# Patient Record
Sex: Female | Born: 1940 | Race: White | Hispanic: Yes | Marital: Married | Smoking: Former smoker
Health system: Southern US, Community
[De-identification: ages and names within clinical notes are randomized; demographics above are authoritative.]

## PROBLEM LIST (undated history)

## (undated) DIAGNOSIS — G8929 Other chronic pain: Secondary | ICD-10-CM

## (undated) DIAGNOSIS — G4700 Insomnia, unspecified: Secondary | ICD-10-CM

## (undated) DIAGNOSIS — F329 Major depressive disorder, single episode, unspecified: Secondary | ICD-10-CM

## (undated) DIAGNOSIS — G894 Chronic pain syndrome: Secondary | ICD-10-CM

## (undated) DIAGNOSIS — Z8673 Personal history of transient ischemic attack (TIA), and cerebral infarction without residual deficits: Secondary | ICD-10-CM

## (undated) DIAGNOSIS — K219 Gastro-esophageal reflux disease without esophagitis: Secondary | ICD-10-CM

## (undated) DIAGNOSIS — K5792 Diverticulitis of intestine, part unspecified, without perforation or abscess without bleeding: Secondary | ICD-10-CM

## (undated) DIAGNOSIS — F32A Depression, unspecified: Secondary | ICD-10-CM

## (undated) DIAGNOSIS — G459 Transient cerebral ischemic attack, unspecified: Secondary | ICD-10-CM

## (undated) DIAGNOSIS — N189 Chronic kidney disease, unspecified: Secondary | ICD-10-CM

## (undated) DIAGNOSIS — I10 Essential (primary) hypertension: Secondary | ICD-10-CM

## (undated) DIAGNOSIS — I2510 Atherosclerotic heart disease of native coronary artery without angina pectoris: Secondary | ICD-10-CM

## (undated) DIAGNOSIS — D509 Iron deficiency anemia, unspecified: Secondary | ICD-10-CM

## (undated) DIAGNOSIS — M1990 Unspecified osteoarthritis, unspecified site: Secondary | ICD-10-CM

## (undated) DIAGNOSIS — M961 Postlaminectomy syndrome, not elsewhere classified: Secondary | ICD-10-CM

## (undated) DIAGNOSIS — D649 Anemia, unspecified: Secondary | ICD-10-CM

## (undated) DIAGNOSIS — E785 Hyperlipidemia, unspecified: Secondary | ICD-10-CM

## (undated) DIAGNOSIS — I639 Cerebral infarction, unspecified: Secondary | ICD-10-CM

## (undated) HISTORY — PX: JOINT REPLACEMENT: SHX530

## (undated) HISTORY — PX: ABDOMINAL HYSTERECTOMY: SHX81

## (undated) HISTORY — PX: FRACTURE SURGERY: SHX138

## (undated) HISTORY — PX: KNEE ARTHROSCOPY: SUR90

---
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
## 2012-08-29 DIAGNOSIS — K219 Gastro-esophageal reflux disease without esophagitis: Secondary | ICD-10-CM | POA: Insufficient documentation

## 2012-08-29 DIAGNOSIS — E785 Hyperlipidemia, unspecified: Secondary | ICD-10-CM | POA: Insufficient documentation

## 2012-08-29 DIAGNOSIS — G459 Transient cerebral ischemic attack, unspecified: Secondary | ICD-10-CM | POA: Insufficient documentation

## 2012-08-29 DIAGNOSIS — K5792 Diverticulitis of intestine, part unspecified, without perforation or abscess without bleeding: Secondary | ICD-10-CM | POA: Insufficient documentation

## 2012-08-29 HISTORY — DX: Gastro-esophageal reflux disease without esophagitis: K21.9

## 2013-03-04 DIAGNOSIS — G4700 Insomnia, unspecified: Secondary | ICD-10-CM | POA: Insufficient documentation

## 2013-03-04 DIAGNOSIS — I10 Essential (primary) hypertension: Secondary | ICD-10-CM | POA: Insufficient documentation

## 2013-07-27 DIAGNOSIS — M1990 Unspecified osteoarthritis, unspecified site: Secondary | ICD-10-CM | POA: Insufficient documentation

## 2014-01-12 DIAGNOSIS — F32A Depression, unspecified: Secondary | ICD-10-CM | POA: Insufficient documentation

## 2014-07-21 DIAGNOSIS — M5136 Other intervertebral disc degeneration, lumbar region: Secondary | ICD-10-CM | POA: Insufficient documentation

## 2019-02-25 ENCOUNTER — Encounter: Payer: Self-pay | Admitting: Emergency Medicine

## 2019-02-25 ENCOUNTER — Observation Stay
Admission: EM | Admit: 2019-02-25 | Discharge: 2019-02-26 | Disposition: A | Discharge status: Home / Self Care | Payer: Medicare Other | Attending: Internal Medicine | Admitting: Internal Medicine

## 2019-02-25 ENCOUNTER — Other Ambulatory Visit: Payer: Self-pay

## 2019-02-25 ENCOUNTER — Observation Stay (HOSPITAL_BASED_OUTPATIENT_CLINIC_OR_DEPARTMENT_OTHER)
Admit: 2019-02-25 | Discharge: 2019-02-25 | Disposition: A | Discharge status: Home / Self Care | Payer: Medicare Other | Attending: Internal Medicine | Admitting: Internal Medicine

## 2019-02-25 ENCOUNTER — Observation Stay: Payer: Medicare Other

## 2019-02-25 ENCOUNTER — Emergency Department: Payer: Medicare Other

## 2019-02-25 DIAGNOSIS — I2510 Atherosclerotic heart disease of native coronary artery without angina pectoris: Secondary | ICD-10-CM | POA: Diagnosis not present

## 2019-02-25 DIAGNOSIS — R079 Chest pain, unspecified: Secondary | ICD-10-CM

## 2019-02-25 DIAGNOSIS — I119 Hypertensive heart disease without heart failure: Secondary | ICD-10-CM | POA: Diagnosis not present

## 2019-02-25 DIAGNOSIS — I080 Rheumatic disorders of both mitral and aortic valves: Secondary | ICD-10-CM | POA: Diagnosis not present

## 2019-02-25 DIAGNOSIS — R001 Bradycardia, unspecified: Secondary | ICD-10-CM | POA: Diagnosis not present

## 2019-02-25 DIAGNOSIS — G4700 Insomnia, unspecified: Secondary | ICD-10-CM | POA: Insufficient documentation

## 2019-02-25 DIAGNOSIS — Z87891 Personal history of nicotine dependence: Secondary | ICD-10-CM | POA: Diagnosis not present

## 2019-02-25 DIAGNOSIS — Z7982 Long term (current) use of aspirin: Secondary | ICD-10-CM | POA: Diagnosis not present

## 2019-02-25 DIAGNOSIS — R778 Other specified abnormalities of plasma proteins: Secondary | ICD-10-CM

## 2019-02-25 DIAGNOSIS — Z8673 Personal history of transient ischemic attack (TIA), and cerebral infarction without residual deficits: Secondary | ICD-10-CM | POA: Insufficient documentation

## 2019-02-25 DIAGNOSIS — I160 Hypertensive urgency: Secondary | ICD-10-CM | POA: Diagnosis not present

## 2019-02-25 DIAGNOSIS — N179 Acute kidney failure, unspecified: Secondary | ICD-10-CM | POA: Diagnosis not present

## 2019-02-25 DIAGNOSIS — I214 Non-ST elevation (NSTEMI) myocardial infarction: Secondary | ICD-10-CM

## 2019-02-25 DIAGNOSIS — R0602 Shortness of breath: Secondary | ICD-10-CM | POA: Diagnosis not present

## 2019-02-25 DIAGNOSIS — Z79899 Other long term (current) drug therapy: Secondary | ICD-10-CM | POA: Insufficient documentation

## 2019-02-25 DIAGNOSIS — Z20828 Contact with and (suspected) exposure to other viral communicable diseases: Secondary | ICD-10-CM | POA: Insufficient documentation

## 2019-02-25 DIAGNOSIS — I10 Essential (primary) hypertension: Secondary | ICD-10-CM | POA: Diagnosis not present

## 2019-02-25 DIAGNOSIS — Z8249 Family history of ischemic heart disease and other diseases of the circulatory system: Secondary | ICD-10-CM | POA: Insufficient documentation

## 2019-02-25 DIAGNOSIS — R7989 Other specified abnormal findings of blood chemistry: Secondary | ICD-10-CM | POA: Diagnosis not present

## 2019-02-25 HISTORY — DX: Cerebral infarction, unspecified: I63.9

## 2019-02-25 HISTORY — DX: Atherosclerotic heart disease of native coronary artery without angina pectoris: I25.10

## 2019-02-25 HISTORY — DX: Insomnia, unspecified: G47.00

## 2019-02-25 HISTORY — DX: Essential (primary) hypertension: I10

## 2019-02-25 HISTORY — DX: Other chronic pain: G89.29

## 2019-02-25 LAB — COMPREHENSIVE METABOLIC PANEL
ALT: 17 U/L (ref 0–44)
AST: 25 U/L (ref 15–41)
Albumin: 4.5 g/dL (ref 3.5–5.0)
Alkaline Phosphatase: 68 U/L (ref 38–126)
Anion gap: 11 (ref 5–15)
BUN: 26 mg/dL — ABNORMAL HIGH (ref 8–23)
CO2: 22 mmol/L (ref 22–32)
Calcium: 9.7 mg/dL (ref 8.9–10.3)
Chloride: 103 mmol/L (ref 98–111)
Creatinine, Ser: 1.17 mg/dL — ABNORMAL HIGH (ref 0.44–1.00)
GFR calc Af Amer: 51 mL/min — ABNORMAL LOW (ref >60)
GFR calc non Af Amer: 44 mL/min — ABNORMAL LOW (ref >60)
Glucose, Bld: 91 mg/dL (ref 70–99)
Potassium: 3.9 mmol/L (ref 3.5–5.1)
Sodium: 136 mmol/L (ref 135–145)
Total Bilirubin: 0.9 mg/dL (ref 0.3–1.2)
Total Protein: 7.5 g/dL (ref 6.5–8.1)

## 2019-02-25 LAB — LIPID PANEL
Cholesterol: 290 mg/dL — ABNORMAL HIGH (ref 0–200)
HDL: 90 mg/dL (ref >40)
LDL Cholesterol: 181 mg/dL — ABNORMAL HIGH (ref 0–99)
Total CHOL/HDL Ratio: 3.2 RATIO
Triglycerides: 94 mg/dL (ref <150)
VLDL: 19 mg/dL (ref 0–40)

## 2019-02-25 LAB — LIPASE, BLOOD: Lipase: 29 U/L (ref 11–51)

## 2019-02-25 LAB — MAGNESIUM: Magnesium: 2.4 mg/dL (ref 1.7–2.4)

## 2019-02-25 LAB — CBC
HCT: 39.1 % (ref 36.0–46.0)
Hemoglobin: 13 g/dL (ref 12.0–15.0)
MCH: 30.2 pg (ref 26.0–34.0)
MCHC: 33.2 g/dL (ref 30.0–36.0)
MCV: 90.7 fL (ref 80.0–100.0)
Platelets: 173 10*3/uL (ref 150–400)
RBC: 4.31 MIL/uL (ref 3.87–5.11)
RDW: 13.9 % (ref 11.5–15.5)
WBC: 5.7 10*3/uL (ref 4.0–10.5)
nRBC: 0 % (ref 0.0–0.2)

## 2019-02-25 LAB — FIBRIN DERIVATIVES D-DIMER (ARMC ONLY): Fibrin derivatives D-dimer (ARMC): 2896.34 ng/mL (FEU) — ABNORMAL HIGH (ref 0.00–499.00)

## 2019-02-25 LAB — PROTIME-INR
INR: 1 (ref 0.8–1.2)
Prothrombin Time: 12.9 seconds (ref 11.4–15.2)

## 2019-02-25 LAB — HEMOGLOBIN A1C
Hgb A1c MFr Bld: 5.5 % (ref 4.8–5.6)
Mean Plasma Glucose: 111.15 mg/dL

## 2019-02-25 LAB — HEPARIN LEVEL (UNFRACTIONATED): Heparin Unfractionated: 0.16 IU/mL — ABNORMAL LOW (ref 0.30–0.70)

## 2019-02-25 LAB — SARS CORONAVIRUS 2 BY RT PCR (HOSPITAL ORDER, PERFORMED IN ~~LOC~~ HOSPITAL LAB): SARS Coronavirus 2: NEGATIVE

## 2019-02-25 LAB — TROPONIN I (HIGH SENSITIVITY)
Troponin I (High Sensitivity): 121 ng/L (ref <18)
Troponin I (High Sensitivity): 107 ng/L (ref <18)

## 2019-02-25 LAB — TSH: TSH: 1.847 u[IU]/mL (ref 0.350–4.500)

## 2019-02-25 LAB — APTT: aPTT: <24 seconds — ABNORMAL LOW (ref 24–36)

## 2019-02-25 MED ORDER — HYDROCODONE-ACETAMINOPHEN 10-325 MG PO TABS
1.0000 | ORAL_TABLET | ORAL | Status: DC | PRN
  Filled 2019-02-25: qty 1

## 2019-02-25 MED ORDER — NITROGLYCERIN 0.4 MG SL SUBL: 0.4000 mg | SUBLINGUAL_TABLET | SUBLINGUAL | Status: DC | PRN

## 2019-02-25 MED ORDER — ATORVASTATIN CALCIUM 20 MG PO TABS: 20.0000 mg | ORAL_TABLET | Freq: Every day | ORAL | Status: DC

## 2019-02-25 MED ORDER — LORAZEPAM 2 MG/ML IJ SOLN
0.5000 mg | Freq: Once | INTRAMUSCULAR | Status: AC
  Administered 2019-02-25: 12:00:00 0.5 mg via INTRAVENOUS

## 2019-02-25 MED ORDER — HYDRALAZINE HCL 25 MG PO TABS
25.0000 mg | ORAL_TABLET | Freq: Three times a day (TID) | ORAL | Status: DC
  Administered 2019-02-25: 25 mg via ORAL
  Filled 2019-02-25: qty 1

## 2019-02-25 MED ORDER — ASPIRIN EC 81 MG PO TBEC
81.0000 mg | DELAYED_RELEASE_TABLET | Freq: Every day | ORAL | Status: DC
  Administered 2019-02-26: 81 mg via ORAL
  Filled 2019-02-25: qty 1

## 2019-02-25 MED ORDER — SODIUM CHLORIDE 0.9 % IV SOLN
INTRAVENOUS | Status: DC
  Administered 2019-02-25 – 2019-02-26 (×3): via INTRAVENOUS

## 2019-02-25 MED ORDER — LISINOPRIL 20 MG PO TABS: 20.0000 mg | ORAL_TABLET | Freq: Every day | ORAL | Status: DC

## 2019-02-25 MED ORDER — HEPARIN (PORCINE) 25000 UT/250ML-% IV SOLN
1000.0000 [IU]/h | INTRAVENOUS | Status: DC
  Administered 2019-02-25: 750 [IU]/h via INTRAVENOUS
  Filled 2019-02-25: qty 250

## 2019-02-25 MED ORDER — ATORVASTATIN CALCIUM 20 MG PO TABS
20.0000 mg | ORAL_TABLET | Freq: Every day | ORAL | Status: DC
  Administered 2019-02-25: 20 mg via ORAL
  Filled 2019-02-25: qty 1

## 2019-02-25 MED ORDER — HEPARIN BOLUS VIA INFUSION
1500.0000 [IU] | Freq: Once | INTRAVENOUS | Status: AC
  Administered 2019-02-25: 1500 [IU] via INTRAVENOUS
  Filled 2019-02-25: qty 1500

## 2019-02-25 MED ORDER — HYDRALAZINE HCL 20 MG/ML IJ SOLN
20.0000 mg | Freq: Once | INTRAMUSCULAR | Status: AC
  Administered 2019-02-25: 20 mg via INTRAVENOUS
  Filled 2019-02-25: qty 1

## 2019-02-25 MED ORDER — ASPIRIN 81 MG PO CHEW
324.0000 mg | CHEWABLE_TABLET | Freq: Once | ORAL | Status: AC
  Administered 2019-02-25: 324 mg via ORAL
  Filled 2019-02-25: qty 4

## 2019-02-25 MED ORDER — LORAZEPAM 2 MG/ML IJ SOLN
INTRAMUSCULAR | Status: AC
  Administered 2019-02-25: 0.5 mg via INTRAVENOUS
  Filled 2019-02-25: qty 1

## 2019-02-25 MED ORDER — HYDRALAZINE HCL 10 MG PO TABS
10.0000 mg | ORAL_TABLET | ORAL | Status: DC | PRN
  Filled 2019-02-25: qty 1

## 2019-02-25 MED ORDER — SODIUM CHLORIDE 0.9 % IV SOLN
Freq: Once | INTRAVENOUS | Status: AC
  Administered 2019-02-25: 12:00:00 via INTRAVENOUS

## 2019-02-25 MED ORDER — TRAZODONE HCL 100 MG PO TABS
200.0000 mg | ORAL_TABLET | Freq: Every day | ORAL | Status: DC
  Administered 2019-02-25: 200 mg via ORAL
  Filled 2019-02-25: qty 2

## 2019-02-25 MED ORDER — IOHEXOL 350 MG/ML SOLN
75.0000 mL | Freq: Once | INTRAVENOUS | Status: AC | PRN
  Administered 2019-02-25: 75 mL via INTRAVENOUS

## 2019-02-25 MED ORDER — HEPARIN BOLUS VIA INFUSION
4000.0000 [IU] | Freq: Once | INTRAVENOUS | Status: AC
  Administered 2019-02-25: 4000 [IU] via INTRAVENOUS
  Filled 2019-02-25: qty 4000

## 2019-02-25 MED ORDER — CARVEDILOL 3.125 MG PO TABS
3.1250 mg | ORAL_TABLET | Freq: Two times a day (BID) | ORAL | Status: DC
  Administered 2019-02-25 – 2019-02-26 (×2): 3.125 mg via ORAL
  Filled 2019-02-25 (×2): qty 1

## 2019-02-25 NOTE — ED Notes (Addendum)
Pt up to bedside toilet. Urinated.  

## 2019-02-25 NOTE — ED Notes (Signed)
Verbal per EDP Paduchowski to give Ativan.

## 2019-02-25 NOTE — ED Notes (Signed)
Portable Xray at bedside.

## 2019-02-25 NOTE — ED Notes (Signed)
Pt beginning to relax slowly. Still feels like she "can't breathe" but states that it is getting better. Given warm blankets. Family remains with pt.

## 2019-02-25 NOTE — ED Notes (Signed)
Pt suddenly anxious and sweaty per Radonna Ricker RN. Ally RN completed EKG and called EDP Paduchowski to update. Pt ST on heart monitor. BP readings not necessarily accurate as pt keeps tensing up her arm. Tearful.

## 2019-02-25 NOTE — ED Notes (Signed)
Pt requesting food. Messaged attending provider.

## 2019-02-25 NOTE — ED Notes (Signed)
Pt c/o feeling like her "L foot isn't getting good circulation." Warm, appropriate color, pulse 2+, cap refill <3 sec. Pt can wiggle toes and move leg.

## 2019-02-25 NOTE — Consult Note (Signed)
ANTICOAGULATION CONSULT NOTE - Initial Consult  Pharmacy Consult for Heparin infusion doing and monitoring   Indication: chest pain/ACS  No Known Allergies  Patient Measurements: Height: 5\' 5"  (165.1 cm) Weight: 135 lb 9.6 oz (61.5 kg) IBW/kg (Calculated) : 57  Vital Signs: Temp: 98.7 F (37.1 C) (09/29 0855) Temp Source: Oral (09/29 0855) BP: 124/49 (09/29 1534) Pulse Rate: 80 (09/29 1534)  Labs: Recent Labs    02/25/19 0944 02/25/19 1117 02/25/19 1919  HGB 13.0  --   --   HCT 39.1  --   --   PLT 173  --   --   APTT <24*  --   --   LABPROT 12.9  --   --   INR 1.0  --   --   HEPARINUNFRC  --   --  0.16*  CREATININE 1.17*  --   --   TROPONINIHS 121* 107*  --     Estimated Creatinine Clearance: 35.1 mL/min (A) (by C-G formula based on SCr of 1.17 mg/dL (H)).   Medical History: Past Medical History:  Diagnosis Date  . Chronic back pain   . Coronary artery disease   . Hypertension   . Insomnia   . Stroke [facility])    Assessment: Pharmacy consulted for heparin infusion dosing and monitoring for ACS/STEMI for 78 yo female. No anticoagulants reported PTA.   9/29@1930 : HL 0.16  Goal of Therapy:  Heparin level 0.3-0.7 units/ml Monitor platelets by anticoagulation protocol: Yes   Plan:  Will rebolus with 1500 units x 1 Increasing rate of heparin infusion to 850 units/hr Check anti-Xa level in 8 hours and daily while on heparin Continue to monitor H&H and platelets  Pearla Dubonnet, PharmD Clinical Pharmacist 02/25/2019 7:50 PM

## 2019-02-25 NOTE — Consult Note (Signed)
Cardiology Consultation:   Patient ID: Emma Houston MRN: [id number]; DOB: 08-29-1940  Admit date: 02/25/2019 Date of Consult: 02/25/2019  Primary Care Provider: System, Pcp Not In Primary Cardiologist:[facility], Dr. Okey Dupre rounding Primary Electrophysiologist:  None    Patient Profile:   Emma Houston is a 78 y.o. female with a hx of HTN, TIA, remote history of smoking (quit ~40 years ago), and no known history of heart disease or arrhythmia who is being seen today for the evaluation of SOB and chest pain with troponin elevation at the request of Dr. Enid Baas.  History of Present Illness:   Emma Houston is a 78 year old female with PMH as above.  On interview today, both patient and daughter deny previous history of heart disease or arrhythmia.  She reportedly has a history of smoking (quit ~40 years ago) and very rare alcohol use (wine 1-2 times per month). Her father passed away from a heart attack in his 56s.  She also reported a strong family history of diabetes.  On 02/24/2019, the patient reported that she was watching television with her family when she became acutely short of breath with associated anxiety.  She denied any additional/associated symptoms with her shortness of breath.  No chest pain, racing heart rate, palpitations, presyncope, or syncope. She also denied any recent lower extremity edema, abdominal distention, or orthopnea.  The shortness of breath started at rest and caused her so much anxiety that she immediately threw off her coat and went outside in an attempt to feel some improvement. She is unable to remember if she felt associated flushing or diaphoresis at that time.  She went outside without much improvement in her symptoms.  She continued to feel shortness of breath on and off throughout the night and did not sleep well.  She also noted some recent left lower extremity pain, though denied associated edema or erythema.  The morning of 02/25/2019, she felt mild to  moderate left-sided, non-radiating, non-positional, and nonpleuritic chest pain that was not tender to palpation.  She had never experienced chest pain like this before in the past.  She is unable to recall how long this episode of chest pain lasted.  When she continued to feel short of breath, she decided to report to [facility] emergency department.    In the ED, vitals were significant for blood pressure 194/71 and heart rate of 55 bpm.  Labs significant for creatinine 1.17, BUN 26, potassium 3.9, AST 25, ALT 17, hemoglobin 13.0, WBC 5.7, HS Tn 121 and flat trending.  EKG bradycardic with rate 57bpm and prolonged PRi suspicious for AVB (with repeat EKG pending) and with significant baseline wander. Also noted were repolarization changes with IVCD / QRS 98; however, no acute ST or T changes. Given EKG difficult to assess due to baseline wander, repeat EKG ordered.  She was started on a heparin drip with cardiology consulted.   At the time of cardiology consultation, she denied any current chest pain.  Her main concern was noted to be her shortness of breath.  During the physical exam, chest pain was not reproducible by palpation and she noted her last episode of chest pain is occurring that morning but was still unable to recall the length of the chest pain episode or if the CP was improved with medications in the ED.   Heart Pathway Score:     Past Medical History:  Diagnosis Date  . Chronic back pain   . Coronary artery disease   . Hypertension   .  Insomnia   . Stroke [facility])     History reviewed. No pertinent surgical history.   Home Medications:  Prior to Admission medications   Medication Sig Start Date End Date Taking? Authorizing Provider  HYDROcodone-acetaminophen (NORCO) 10-325 MG tablet Take 1 tablet by mouth every 4 (four) hours as needed for pain. 01/31/19  Yes [provider]  lisinopril (ZESTRIL) 20 MG tablet Take 20 mg by mouth daily. 02/12/19  Yes [provider]   traZODone (DESYREL) 100 MG tablet Take 200 mg by mouth at bedtime. 02/22/19  Yes [provider]    Inpatient Medications: Scheduled Meds: . [START ON 02/26/2019] aspirin EC  81 mg Oral Daily  . carvedilol  3.125 mg Oral BID WC  . hydrALAZINE  25 mg Oral Q8H  . lisinopril  20 mg Oral Daily  . traZODone  200 mg Oral QHS   Continuous Infusions: . heparin 750 Units/hr (02/25/19 1134)   PRN Meds: HYDROcodone-acetaminophen, nitroGLYCERIN  Allergies:   No Known Allergies  Social History:   Social History   Socioeconomic History  . Marital status: Married    Spouse name: Not on file  . Number of children: Not on file  . Years of education: Not on file  . Highest education level: Not on file  Occupational History  . Not on file  Social Needs  . Financial resource strain: Not on file  . Food insecurity    Worry: Not on file    Inability: Not on file  . Transportation needs    Medical: Not on file    Non-medical: Not on file  Tobacco Use  . Smoking status: Former Games developer  . Smokeless tobacco: Never Used  Substance and Sexual Activity  . Alcohol use: Yes    Alcohol/week: 1.0 standard drinks    Types: 1 Cans of beer per week  . Drug use: Yes    Comment: prescribed for chronic back pain  . Sexual activity: Not on file  Lifestyle  . Physical activity    Days per week: Not on file    Minutes per session: Not on file  . Stress: Not on file  Relationships  . Social Musician on phone: Not on file    Gets together: Not on file    Attends religious service: Not on file    Active member of club or organization: Not on file    Attends meetings of clubs or organizations: Not on file    Relationship status: Not on file  . Intimate partner violence    Fear of current or ex partner: Not on file    Emotionally abused: Not on file    Physically abused: Not on file    Forced sexual activity: Not on file  Other Topics Concern  . Not on file  Social History  Narrative  . Not on file    Family History:   Family history of CAD with father deceased from heart attack in his 73s.  Family history of diabetes.  ROS:  Please see the history of present illness.  Review of Systems  Respiratory: Positive for shortness of breath. Negative for hemoptysis.   Cardiovascular: Positive for chest pain and PND. Negative for palpitations, orthopnea and leg swelling.  Gastrointestinal: Negative for nausea and vomiting.  Musculoskeletal: Negative for falls.  Neurological: Negative for loss of consciousness.  Psychiatric/Behavioral: The patient is nervous/anxious.   All other systems reviewed and are negative.   All other ROS reviewed and  negative.     Physical Exam/Data:   Vitals:   02/25/19 1326 02/25/19 1334 02/25/19 1345 02/25/19 1400  BP:  (!) 151/64    Pulse: 83  83 81  Resp: 18  14 13   Temp:      TempSrc:      SpO2: 97%  97% 97%  Weight:      Height:       No intake or output data in the 24 hours ending 02/25/19 1416 Last 3 Weights 02/25/2019  Weight (lbs) 143 lb  Weight (kg) 64.864 kg     Body mass index is 23.8 kg/m.  General:  Well nourished, well developed, in no acute distress but anxious on exam/interview.  Joined by her daughter. HEENT: normal Neck: no JVD Vascular: No carotid bruits; radial pulses 2+ bilaterally Cardiac:  normal S1, S2; RRR; no murmur  Lungs:  clear to auscultation bilaterally, no wheezing, rhonchi or rales  Abd: soft, nontender, no hepatomegaly  Ext: no edema Musculoskeletal:  No deformities, BUE and BLE strength normal and equal Skin: warm and dry  Neuro:  No focal abnormalities noted Psych:  Normal affect   EKG:  The EKG was personally reviewed and demonstrates:  EKG bradycardic rate / with prolonged PRi suspicious for first degree AV block; however, significant baseline wander and repeat EKG pending. Possible ectopic rhythm noted. Repolarization changes with IVCD and QRS 98 but no acute ST or T changes  though difficult to assess due to baseline wander. Repeat EKG pending. Telemetry:  Telemetry was personally reviewed and demonstrates:  NSR with rates in the 80s. Earlier bradycardic rates in the 50s. Rare ectopy/ PVC.   Relevant CV Studies: Pending echo  Laboratory Data:  High Sensitivity Troponin:   Recent Labs  Lab 02/25/19 0944 02/25/19 1117  TROPONINIHS 121* 107*     Cardiac EnzymesNo results for input(s): TROPONINI in the last 168 hours. No results for input(s): TROPIPOC in the last 168 hours.  Chemistry Recent Labs  Lab 02/25/19 0944  NA 136  K 3.9  CL 103  CO2 22  GLUCOSE 91  BUN 26*  CREATININE 1.17*  CALCIUM 9.7  GFRNONAA 44*  GFRAA 51*  ANIONGAP 11    Recent Labs  Lab 02/25/19 0944  PROT 7.5  ALBUMIN 4.5  AST 25  ALT 17  ALKPHOS 68  BILITOT 0.9   Hematology Recent Labs  Lab 02/25/19 0944  WBC 5.7  RBC 4.31  HGB 13.0  HCT 39.1  MCV 90.7  MCH 30.2  MCHC 33.2  RDW 13.9  PLT 173   BNPNo results for input(s): BNP, PROBNP in the last 168 hours.  DDimer No results for input(s): DDIMER in the last 168 hours.   Radiology/Studies:  Dg Chest Portable 1 View  Result Date: 02/25/2019 CLINICAL DATA:  Chest pain, shortness of breath EXAM: PORTABLE CHEST 1 VIEW COMPARISON:  None. FINDINGS: The heart size and mediastinal contours are within normal limits. Both lungs are clear. Levoscoliosis of the thoracic spine. IMPRESSION: No acute abnormality of the lungs in AP portable projection. Electronically Signed   By: Lauralyn PrimesAlex  Bibbey M.D.   On: 02/25/2019 09:24    Assessment and Plan:   Chest pain with minimally elevated / flat trending troponin --No current CP.  Cannot rule out cardiac etiology and that shortness of breath is her anginal equivalent as she has several risk factors for cardiac etiology, including previous history of smoking and poorly controlled blood pressure.  EKG as above wiith repeat EKG  ordered to confirm findings. HS Tn minimally elevated  and flat trending, peaked at 121, now down-trending, and more consistent with supply demand ischemia with SBP into the 200s on arrival.  --Consider chest pain / SOB in the setting of elevated blood pressure.  Patient unable to report if chest pain improved with SBP decrease. --Cannot rule out pulmonary embolism with report of left leg pain. Exam not consistent with DVT given no asymmetric edema, erythema, or palpable cord on exam. Echo pending. Will evaluate echo for any signs of right heart strain.  Already on IV heparin with recommendation to continue at this time.  --Pending labs for further risk stratification, including lipid panel and A1c. --No plan for emergent cardiac catheterization at this time unless echo with significantly reduced EF. Consider NM study / stress testing in the AM for further risk stratification if no further chest pain reported / bump in HS Tn and pending echo results.  --Continue medical management with ASA, heparin, Coreg, and PTA lisinopril (ACE as renal function allows). SL nitro as needed for CP. Pending lipid panel, consider statin before discharge. Daily BMET as baseline renal function unknown and to monitor electrolytes.  Daily CBC on IV heparin. Further recommendations pending echo.  Bradycardia, resolved --Earlier bradycardic rates noted in EMR with higher SBP and now resolved with repeat EKG pending to assess rhythm  / PR interval given baseline wander on initial EKG. --Currently, rates in upper 80s-90s with improved BP. Started on Coreg given SBP in the 150s-190s. Continue as HR allows and pending EKG.    HTN --Poorly controlled upon arrival to ED with SBP in the 200s. Continue PTA lisinopril 20mg  qd. Started on low-dose Coreg 3.125 mg twice daily as heart rate allows.  Continue with hydralazine. Further recommendations pending echo.  History of TIA --Patient recently relocated to [facility] and no current access to medical records.   Reduced renal function / AKI  --Baseline Cr unknown. Monitor given elevated BUN and on ACEi.   For questions or updates, please contact [place] Please consult [email or web address] for contact info under     Signed, Arvil Chaco, PA-C  02/25/2019 2:16 PM

## 2019-02-25 NOTE — ED Triage Notes (Signed)
Says not feeling wsell since Friday--vomited x 1 then yedsterday started chest pain and short of breath

## 2019-02-25 NOTE — Consult Note (Signed)
ANTICOAGULATION CONSULT NOTE - Initial Consult  Pharmacy Consult for Heparin infusion doing and monitoring   Indication: chest pain/ACS  No Known Allergies  Patient Measurements: Height: 5\' 5"  (165.1 cm) Weight: 143 lb (64.9 kg) IBW/kg (Calculated) : 57  Vital Signs: Temp: 98.7 F (37.1 C) (09/29 0855) Temp Source: Oral (09/29 0855) BP: 125/93 (09/29 1000) Pulse Rate: 57 (09/29 1000)  Labs: Recent Labs    02/25/19 0944  HGB 13.0  HCT 39.1  PLT 173  CREATININE 1.17*  TROPONINIHS 121*    Estimated Creatinine Clearance: 35.1 mL/min (A) (by C-G formula based on SCr of 1.17 mg/dL (H)).   Medical History: Past Medical History:  Diagnosis Date  . Chronic back pain   . Coronary artery disease   . Hypertension   . Insomnia   . Stroke [facility])    Assessment: Pharmacy consulted for heparin infusion dosing and monitoring for ACS/STEMI for 78 yo female. No anticoagulants reported PTA.   Troponin I (HS): 121  Goal of Therapy:  Heparin level 0.3-0.7 units/ml Monitor platelets by anticoagulation protocol: Yes   Plan:  Give 4000 units bolus x 1 Start heparin infusion at 750 units/hr Check anti-Xa level in 8 hours and daily while on heparin Continue to monitor H&H and platelets  Pernell Dupre, PharmD, BCPS Clinical Pharmacist 02/25/2019 10:46 AM

## 2019-02-25 NOTE — Progress Notes (Signed)
Care Alignment Note  Advanced Directives Documents (Living Will, Power of Attorney) currently in the EHR no advanced directives documents available .  Has the patient discussed their wishes with their family/healthcare power of attorney no.  What does the patient/decision maker understand about their medical condition and the natural course of their disease.  Chest pain.  Elevated troponin.  Hypertensive urgency.  CVA.  What is the patient/decision maker's biggest fear or concern for the future becoming a burden to my family  What is the most important goal for this patient should their health condition worsen maintenance of function.  Current   Code Status: Full Code  Current code status has been reviewed/updated.  Time spent:17 minutes

## 2019-02-25 NOTE — ED Provider Notes (Addendum)
[facility] Emergency Department Provider Note  Time seen: 9:00 AM  I have reviewed the triage vital signs and the nursing notes.   HISTORY  Chief Complaint Chest Pain and Shortness of Breath   HPI Emma Houston is a 78 y.o. female with a past medical history of CAD, hypertension, CVA, presents to the emergency department for chest pain or shortness of breath.  According to the patient since last night she has been feeling mildly short of breath with chest discomfort in the center of her chest.  Describes the chest pain as an aching 4/10 pain currently.  Patient denies any cough or fever.  Did state intermittent nausea over the weekend with one episode of vomiting on Friday.  Denies leg pain or swelling.   Past Medical History:  Diagnosis Date  . Coronary artery disease   . Hypertension   . Stroke [facility])     There are no active problems to display for this patient.    Prior to Admission medications   Not on File    Not on File  No family history on file.  Social History Social History   Tobacco Use  . Smoking status: Not on file  Substance Use Topics  . Alcohol use: Not on file  . Drug use: Not on file    Review of Systems Constitutional: Negative for fever. Cardiovascular: Negative for chest pain. Respiratory: Negative for shortness of breath. Gastrointestinal: Negative for abdominal pain.  Intermittent nausea over the weekend with one episode of vomiting on Friday. Musculoskeletal: Negative for leg pain or swelling. Skin: Negative for skin complaints  Neurological: Negative for headache All other ROS negative  ____________________________________________   PHYSICAL EXAM:  VITAL SIGNS: ED Triage Vitals [02/25/19 0855]  Enc Vitals Group     BP (!) 190/72     Pulse Rate (!) 57     Resp 18     Temp 98.7 F (37.1 C)     Temp Source Oral     SpO2 100 %     Weight      Height      Head Circumference      Peak Flow      Pain  Score      Pain Loc      Pain Edu?      Excl. in GC?    Constitutional: Patient is awake alert oriented.  Patient is moderately anxious in appearance. Eyes: Normal exam ENT      Head: Normocephalic and atraumatic.      Mouth/Throat: Mucous membranes are moist. Cardiovascular: Normal rate, regular rhythm. No murmur Respiratory: Normal respiratory effort without tachypnea nor retractions. Breath sounds are clear Gastrointestinal: Soft and nontender. No distention.   Musculoskeletal: Nontender with normal range of motion in all extremities.  Neurologic:  Normal speech and language. No gross focal neurologic deficits Skin:  Skin is warm, dry and intact.  Psychiatric: Anxious appearing otherwise normal mood   ____________________________________________    EKG  EKG viewed and interpreted by myself shows a sinus rhythm at 57 bpm with a narrow QRS, normal axis, normal intervals, nonspecific but no concerning ST changes.  ____________________________________________    RADIOLOGY  Chest x-ray shows no acute findings.  ____________________________________________   INITIAL IMPRESSION / ASSESSMENT AND PLAN / ED COURSE  Pertinent labs & imaging results that were available during my care of the patient were reviewed by me and considered in my medical decision making (see chart for details).   Patient  presents to the emergency department for chest pain or shortness of breath since last night.  Differential would include ACS, pulmonary edema, pneumonia, pneumothorax, musculoskeletal/chest wall pain.  We will check labs, chest x-ray dose aspirin and continue to closely monitor.  Patient agreeable to plan of care.  Chest x-ray is negative.  Troponin is elevated at 121 consistent with NSTEMI.  We will order heparin infusion for the patient.  Patient will be admitted to the hospitalist service.  I will discuss with cardiology.  Repeat EKG viewed and interpreted by myself shows sinus  tachycardia 106 bpm with a narrow QRS, normal axis, normal intervals, nonspecific ST changes.  Patient did complain of acute onset of shortness of breath.  Patient did appear quite anxious and admits to feeling anxious.  We will treat with a small dose of Ativan.  Patient's blood pressure has decreased currently 166A systolic after hydralazine.  Emma Houston was evaluated in Emergency Department on 02/25/2019 for the symptoms described in the history of present illness. She was evaluated in the context of the global COVID-19 pandemic, which necessitated consideration that the patient might be at risk for infection with the SARS-CoV-2 virus that causes COVID-19. Institutional protocols and algorithms that pertain to the evaluation of patients at risk for COVID-19 are in a state of rapid change based on information released by regulatory bodies including the CDC and federal and state organizations. These policies and algorithms were followed during the patient's care in the ED.  CRITICAL CARE Performed by: Harvest Dark   Total critical care time: 30 minutes  Critical care time was exclusive of separately billable procedures and treating other patients.  Critical care was necessary to treat or prevent imminent or life-threatening deterioration.  Critical care was time spent personally by me on the following activities: development of treatment plan with patient and/or surrogate as well as nursing, discussions with consultants, evaluation of patient's response to treatment, examination of patient, obtaining history from patient or surrogate, ordering and performing treatments and interventions, ordering and review of laboratory studies, ordering and review of radiographic studies, pulse oximetry and re-evaluation of patient's condition.  ____________________________________________   FINAL CLINICAL IMPRESSION(S) / ED DIAGNOSES  Chest pain Shortness of breath NSTEMI   Harvest Dark, MD 02/25/19 1047    Harvest Dark, MD 02/25/19 1217

## 2019-02-25 NOTE — ED Notes (Signed)
Date and time results received: 02/25/19 10:24 (use smartphrase ".now" to insert current time)  Test: troponin Critical Value: 121  Name of Provider Notified: paduchowski  Orders Received? Or Actions Taken?: no new orders at this time

## 2019-02-25 NOTE — H&P (Addendum)
[facility]   PATIENT NAME: Emma Houston    MR#:  [id number]  DATE OF BIRTH:  Aug 29, 1940  DATE OF ADMISSION:  02/25/2019  PRIMARY CARE PHYSICIAN: System, Pcp Not In   REQUESTING/REFERRING PHYSICIAN: Minna AntisPaduchowski, Kevin  CHIEF COMPLAINT:   Chief Complaint  Patient presents with  . Chest Pain  . Shortness of Breath    HISTORY OF PRESENT ILLNESS:  Emma Houston  is a 78 y.o. female with a known history of coronary artery disease, hypertension and CVA who [place] and living with daughter here in town and was brought to the emergency room with complaints of chest pain and shortness of breath that started since yesterday.  Severity was 5/10.  Already improved significantly at the time of my evaluation.  Chest pain located on the left side of the chest.  Nonradiating.  Some associated shortness of breath.  Slightly reproducible by palpation.  Not pleuritic.  Patient was evaluated in the emergency room.  Troponin level elevated at 121 twelve-lead EKG done reviewed revealed sinus bradycardia with rate of 57.  Emergency room provider discussed with cardiologist on-call Dr. Okey DupreEnd.  Patient already started on heparin drip.  Medical service called to admit for further evaluation and management.  PAST MEDICAL HISTORY:   Past Medical History:  Diagnosis Date  . Chronic back pain   . Coronary artery disease   . Hypertension   . Insomnia   . Stroke [facility](HCC)     PAST SURGICAL HISTORY:  History reviewed. No pertinent surgical history.  SOCIAL HISTORY:   Social History   Tobacco Use  . Smoking status: Former Games developermoker  . Smokeless tobacco: Never Used  Substance Use Topics  . Alcohol use: Yes    Alcohol/week: 1.0 standard drinks    Types: 1 Cans of beer per week    FAMILY HISTORY:  History reviewed. No pertinent family history.  DRUG ALLERGIES:  No Known Allergies  REVIEW OF SYSTEMS:   Review of Systems   Constitutional: Negative for chills and fever.  HENT: Negative for hearing loss and tinnitus.   Eyes: Negative for blurred vision and double vision.  Respiratory: Positive for shortness of breath. Negative for cough.   Cardiovascular: Positive for chest pain. Negative for palpitations.  Gastrointestinal: Negative for abdominal pain, heartburn, nausea and vomiting.  Genitourinary: Negative for dysuria and urgency.  Musculoskeletal: Negative for myalgias and neck pain.  Skin: Negative for itching and rash.  Neurological: Negative for dizziness and headaches.  Psychiatric/Behavioral: Negative for depression and hallucinations.    MEDICATIONS AT HOME:   Prior to Admission medications   Medication Sig Start Date End Date Taking? Authorizing Provider  HYDROcodone-acetaminophen (NORCO) 10-325 MG tablet Take 1 tablet by mouth every 4 (four) hours as needed for pain. 01/31/19  Yes [provider]  lisinopril (ZESTRIL) 20 MG tablet Take 20 mg by mouth daily. 02/12/19  Yes [provider]  traZODone (DESYREL) 100 MG tablet Take 200 mg by mouth at bedtime. 02/22/19  Yes [provider]      VITAL SIGNS:  Blood pressure (!) 194/71, pulse (!) 55, temperature 98.7 F (37.1 C), temperature source Oral, resp. rate 18, height 5\' 5"  (1.651 m), weight 64.9 kg, SpO2 98 %.  PHYSICAL EXAMINATION:  Physical Exam  GENERAL:  78 y.o.-year-old patient lying in the bed with no acute distress.  EYES: Pupils equal, round, reactive to light and accommodation. No scleral icterus. Extraocular muscles intact.  HEENT: Head  atraumatic, normocephalic. Oropharynx and nasopharynx clear.  NECK:  Supple, no jugular venous distention. No thyroid enlargement, no tenderness.  LUNGS: Normal breath sounds bilaterally, no wheezing, rales,rhonchi or crepitation. No use of accessory muscles of respiration.  CARDIOVASCULAR: S1, S2 normal. No murmurs, rubs, or gallops.  ABDOMEN: Soft, nontender,  nondistended. Bowel sounds present. No organomegaly or mass.  EXTREMITIES: No pedal edema, cyanosis, or clubbing.  NEUROLOGIC: Cranial nerves II through XII are intact. Muscle strength 5/5 in all extremities. Sensation intact. Gait not checked.  PSYCHIATRIC: The patient is alert and oriented x 3.  SKIN: No obvious rash, lesion, or ulcer.   LABORATORY PANEL:   CBC Recent Labs  Lab 02/25/19 0944  WBC 5.7  HGB 13.0  HCT 39.1  PLT 173   ------------------------------------------------------------------------------------------------------------------  Chemistries  Recent Labs  Lab 02/25/19 0944  NA 136  K 3.9  CL 103  CO2 22  GLUCOSE 91  BUN 26*  CREATININE 1.17*  CALCIUM 9.7  AST 25  ALT 17  ALKPHOS 68  BILITOT 0.9   ------------------------------------------------------------------------------------------------------------------  Cardiac Enzymes No results for input(s): TROPONINI in the last 168 hours. ------------------------------------------------------------------------------------------------------------------  RADIOLOGY:  Dg Chest Portable 1 View  Result Date: 02/25/2019 CLINICAL DATA:  Chest pain, shortness of breath EXAM: PORTABLE CHEST 1 VIEW COMPARISON:  None. FINDINGS: The heart size and mediastinal contours are within normal limits. Both lungs are clear. Levoscoliosis of the thoracic spine. IMPRESSION: No acute abnormality of the lungs in AP portable projection. Electronically Signed   By: Eddie Candle M.D.   On: 02/25/2019 09:24      IMPRESSION AND PLAN:  Patient is a 78 year old female with history of hypertension, CVA and CAD being admitted for evaluation of chest pain and mildly elevated troponin  1.  Chest pain with mildly elevated troponin. This could be likely due to non-ST MI elevation. To follow-up on next set of cardiac enzymes. Patient already started on heparin drip.  Cardiologist Dr. Saunders Revel to see patient. 2D echocardiogram to evaluate  cardiac function. Placed on telemetry.  Continue aspirin 81 mg p.o. daily.  PRN nitroglycerin.  Avoiding due beta-blockers due to sinus bradycardia.  Patient reports that she does not tolerate statins but not sure of the name of the statins Lipid panel in a.m. I was later notified by nursing staff about elevated d-dimer.  Since no plans for cardiac catheterization for now by cardiologist, will request for CTA chest to rule out pulmonary embolism.  Gentle IV fluid hydration for renal protection.  Patient already on heparin drip  2.  Hypertensive urgency With systolic blood pressure greater than 200. Resume home blood pressure medication with lisinopril.  Added p.o. hydralazine. Being given 20 mg of IV hydralazine as well in the emergency room.  3.  Prior history of CVA Does not appear to have any residual deficit.  Continue aspirin.  Patient reports she does not tolerate statins.  4.  Sinus bradycardia with heart rate in the 50s Avoid all AV nodal blocking agents.  DVT prophylaxis; patient being started on heparin drip  All the records are reviewed and case discussed with ED provider. Management plans discussed with the patient, daughter at bedside and they are in agreement.   CODE STATUS: Full code  TOTAL TIME TAKING CARE OF THIS PATIENT: 56 minutes.    Mearl Harewood M.D on 02/25/2019 at 11:46 AM  Between 7am to 6pm - Pager - [phone]  After 6pm go to [email or web address] - [facility]  Office  [phone]  CC: Primary care physician; System, Pcp Not In   Note: This dictation was prepared with Dragon dictation along with smaller phrase technology. Any transcriptional errors that result from this process are unintentional.

## 2019-02-25 NOTE — ED Notes (Signed)
Attempted report. Name and ascom number left. Will call again soon.

## 2019-02-25 NOTE — ED Notes (Signed)
Pt/family given snack and drink and tv remote as requested.

## 2019-02-25 NOTE — Progress Notes (Signed)
   Previous patient records / labs now available per CareEverywhere.   Will reassess statin myalgias, given patient has taken statins in the past despite report of myalgias.   Signed, Arvil Chaco, PA-C 02/25/2019, 4:02 PM Pager [phone]

## 2019-02-25 NOTE — ED Notes (Signed)
Patient given warm blanket.

## 2019-02-25 NOTE — ED Notes (Signed)
2nd troponin collected and sent to lab. Lab called to notify to run.

## 2019-02-26 ENCOUNTER — Encounter: Payer: Self-pay | Admitting: Radiology

## 2019-02-26 ENCOUNTER — Observation Stay (HOSPITAL_BASED_OUTPATIENT_CLINIC_OR_DEPARTMENT_OTHER): Payer: Medicare Other

## 2019-02-26 DIAGNOSIS — R079 Chest pain, unspecified: Secondary | ICD-10-CM

## 2019-02-26 LAB — LIPID PANEL
Cholesterol: 234 mg/dL — ABNORMAL HIGH (ref 0–200)
HDL: 84 mg/dL (ref >40)
LDL Cholesterol: 141 mg/dL — ABNORMAL HIGH (ref 0–99)
Total CHOL/HDL Ratio: 2.8 RATIO
Triglycerides: 44 mg/dL (ref <150)
VLDL: 9 mg/dL (ref 0–40)

## 2019-02-26 LAB — CBC
HCT: 33.5 % — ABNORMAL LOW (ref 36.0–46.0)
Hemoglobin: 11.1 g/dL — ABNORMAL LOW (ref 12.0–15.0)
MCH: 30.6 pg (ref 26.0–34.0)
MCHC: 33.1 g/dL (ref 30.0–36.0)
MCV: 92.3 fL (ref 80.0–100.0)
Platelets: 278 10*3/uL (ref 150–400)
RBC: 3.63 MIL/uL — ABNORMAL LOW (ref 3.87–5.11)
RDW: 14.3 % (ref 11.5–15.5)
WBC: 5.8 10*3/uL (ref 4.0–10.5)
nRBC: 0 % (ref 0.0–0.2)

## 2019-02-26 LAB — BASIC METABOLIC PANEL
Anion gap: 5 (ref 5–15)
BUN: 26 mg/dL — ABNORMAL HIGH (ref 8–23)
CO2: 24 mmol/L (ref 22–32)
Calcium: 8.6 mg/dL — ABNORMAL LOW (ref 8.9–10.3)
Chloride: 108 mmol/L (ref 98–111)
Creatinine, Ser: 1.2 mg/dL — ABNORMAL HIGH (ref 0.44–1.00)
GFR calc Af Amer: 50 mL/min — ABNORMAL LOW (ref >60)
GFR calc non Af Amer: 43 mL/min — ABNORMAL LOW (ref >60)
Glucose, Bld: 86 mg/dL (ref 70–99)
Potassium: 4 mmol/L (ref 3.5–5.1)
Sodium: 137 mmol/L (ref 135–145)

## 2019-02-26 LAB — MAGNESIUM: Magnesium: 2.1 mg/dL (ref 1.7–2.4)

## 2019-02-26 LAB — ECHOCARDIOGRAM COMPLETE
Height: 65 in
Weight: 2169.6 oz

## 2019-02-26 LAB — HEPARIN LEVEL (UNFRACTIONATED)
Heparin Unfractionated: 0.23 IU/mL — ABNORMAL LOW (ref 0.30–0.70)
Heparin Unfractionated: 0.14 IU/mL — ABNORMAL LOW (ref 0.30–0.70)

## 2019-02-26 MED ORDER — REGADENOSON 0.4 MG/5ML IV SOLN
0.4000 mg | Freq: Once | INTRAVENOUS | Status: AC
  Administered 2019-02-26: 0.4 mg via INTRAVENOUS

## 2019-02-26 MED ORDER — ASPIRIN 81 MG PO TBEC: 81.0000 mg | DELAYED_RELEASE_TABLET | Freq: Every day | ORAL | 0 refills | Status: DC

## 2019-02-26 MED ORDER — NITROGLYCERIN 0.4 MG SL SUBL: 0.4000 mg | SUBLINGUAL_TABLET | SUBLINGUAL | 0 refills | Status: DC | PRN

## 2019-02-26 MED ORDER — TECHNETIUM TC 99M TETROFOSMIN IV KIT
10.0000 | PACK | Freq: Once | INTRAVENOUS | Status: AC | PRN
  Administered 2019-02-26: 10.898 via INTRAVENOUS

## 2019-02-26 MED ORDER — HEPARIN BOLUS VIA INFUSION
1500.0000 [IU] | Freq: Once | INTRAVENOUS | Status: AC
  Administered 2019-02-26: 1500 [IU] via INTRAVENOUS
  Filled 2019-02-26: qty 1500

## 2019-02-26 MED ORDER — LISINOPRIL 10 MG PO TABS: 10.0000 mg | ORAL_TABLET | Freq: Every day | ORAL | Status: DC

## 2019-02-26 MED ORDER — CARVEDILOL 3.125 MG PO TABS: 3.1250 mg | ORAL_TABLET | Freq: Two times a day (BID) | ORAL | 0 refills | Status: AC

## 2019-02-26 MED ORDER — ATORVASTATIN CALCIUM 20 MG PO TABS: 20.0000 mg | ORAL_TABLET | Freq: Every day | ORAL | 0 refills | Status: DC

## 2019-02-26 MED ORDER — TECHNETIUM TC 99M TETROFOSMIN IV KIT
30.0000 | PACK | Freq: Once | INTRAVENOUS | Status: AC | PRN
  Administered 2019-02-26: 31.37 via INTRAVENOUS

## 2019-02-26 NOTE — Plan of Care (Signed)
Continues on heparin gtt & IV fluids  Problem: Nutrition: Goal: Adequate nutrition will be maintained Outcome: Progressing   Problem: Coping: Goal: Level of anxiety will decrease Outcome: Progressing   Problem: Pain Managment: Goal: General experience of comfort will improve Outcome: Progressing Note: No complains of pain this shift   Problem: Safety: Goal: Ability to remain free from injury will improve Outcome: Progressing   Problem: Skin Integrity: Goal: Risk for impaired skin integrity will decrease Outcome: Progressing

## 2019-02-26 NOTE — Progress Notes (Signed)
Initial Nutrition Assessment  DOCUMENTATION CODES:   Not applicable  INTERVENTION:  Provide Ensure Enlive po BID, each supplement provides 350 kcal and 20 grams of protein.  NUTRITION DIAGNOSIS:   Inadequate oral intake related to decreased appetite as evidenced by per patient/family report.  GOAL:   Patient will meet greater than or equal to 90% of their needs  MONITOR:   PO intake, Supplement acceptance, Labs, Weight trends, I & O's  REASON FOR ASSESSMENT:   Malnutrition Screening Tool    ASSESSMENT:   78 year old female with PMHx of HTN, CAD, hx CVA, chronic back pain admitted with chest pain and shortness of breath.   Met with patient and her daughter at bedside. Patient reports she has had a decreased appetite and intake for a while now. It initially started when she started following the heart healthy diet because she felt her food did not have any taste. She started eating less and has continued to eat small amounts since then. Patient is amenable to drinking Ensure to help meet calorie/protein needs. No education needs regarding heart healthy diet at this time.  Patient reports her UBW was 170 lbs and that she has lost weight slowly over time. She is currently 63.2 kg (139.4 lbs). No weight history in chart to trend.  Medications reviewed and include: NS at 100 mL/hr, heparin gtt.  Labs reviewed: BUN 26, Creatinine 1.2.  Patient is at risk for malnutrition.  NUTRITION - FOCUSED PHYSICAL EXAM:    Most Recent Value  Orbital Region  No depletion  Upper Arm Region  Mild depletion  Thoracic and Lumbar Region  No depletion  Buccal Region  No depletion  Temple Region  Mild depletion  Clavicle Bone Region  No depletion  Clavicle and Acromion Bone Region  No depletion  Scapular Bone Region  No depletion  Dorsal Hand  Mild depletion  Patellar Region  Mild depletion  Anterior Thigh Region  Mild depletion  Posterior Calf Region  Mild depletion  Edema (RD Assessment)   None  Hair  Reviewed  Eyes  Reviewed  Mouth  Reviewed  Skin  Reviewed  Nails  Reviewed     Diet Order:   Diet Order            Diet Heart Room service appropriate? Yes; Fluid consistency: Thin  Diet effective now        Diet - low sodium heart healthy             EDUCATION NEEDS:   No education needs have been identified at this time  Skin:  Skin Assessment: Reviewed RN Assessment  Last BM:  02/24/2019 per chart  Height:   Ht Readings from Last 1 Encounters:  02/25/19 '5\' 5"'  (1.651 m)   Weight:   Wt Readings from Last 1 Encounters:  02/26/19 63.2 kg   Ideal Body Weight:  56.8 kg  BMI:  Body mass index is 23.2 kg/m.  Estimated Nutritional Needs:   Kcal:  1500-1700  Protein:  75-85 grams  Fluid:  1.5-1.7 L/day  Willey Blade, MS, RD, LDN Office: [phone] Pager: [phone] After Hours/Weekend Pager: [phone]

## 2019-02-26 NOTE — Consult Note (Signed)
[facility] for Heparin infusion dosing and monitoring   Indication: chest pain/ACS  No Known Allergies  Patient Measurements: Height: 5\' 5"  (165.1 cm) Weight: 139 lb 6.4 oz (63.2 kg) IBW/kg (Calculated) : 57  Vital Signs: Temp: 99.3 F (37.4 C) (09/30 0403) Temp Source: Oral (09/30 0403) BP: 116/51 (09/30 0403) Pulse Rate: 70 (09/30 0403)  Labs: Recent Labs    02/25/19 0944 02/25/19 1117 02/25/19 1919 02/26/19 0347  HGB 13.0  --   --  11.1*  HCT 39.1  --   --  33.5*  PLT 173  --   --  278  APTT <24*  --   --   --   LABPROT 12.9  --   --   --   INR 1.0  --   --   --   HEPARINUNFRC  --   --  0.16* 0.23*  CREATININE 1.17*  --   --   --   TROPONINIHS 121* 107*  --   --     Estimated Creatinine Clearance: 35.1 mL/min (A) (by C-G formula based on SCr of 1.17 mg/dL (H)).   Medical History: Past Medical History:  Diagnosis Date  . Chronic back pain   . Coronary artery disease   . Hypertension   . Insomnia   . Stroke [facility])    Assessment: Pharmacy consulted for heparin infusion dosing and monitoring for ACS/STEMI for 78 yo female. No anticoagulants reported PTA.   9/29@1930 : HL 0.16 0930 @ 0347 HL 0.23, subtherapeutic  Goal of Therapy:  Heparin level 0.3-0.7 units/ml Monitor platelets by anticoagulation protocol: Yes   Plan:  Will rebolus with 1500 units x 1 Increasing rate of heparin infusion to 1000 units/hr Check anti-Xa level in 8 hours and daily while on heparin Continue to monitor H&H and platelets  Ena Dawley, PharmD Clinical Pharmacist 02/26/2019 5:38 AM

## 2019-02-26 NOTE — Progress Notes (Signed)
Progress Note  Patient Name: Emma Houston Date of Encounter: 02/26/2019  Primary Cardiologist: [facility]- Dr. Okey Dupre  Subjective   Patient seen this morning in the nuclear stress lab.  States feeling okay.  No acute events over the past 24 hours.  Echo obtained yesterday was normal.  Inpatient Medications    Scheduled Meds:  aspirin EC  81 mg Oral Daily   atorvastatin  20 mg Oral q1800   carvedilol  3.125 mg Oral BID WC   lisinopril  20 mg Oral Daily   traZODone  200 mg Oral QHS   Continuous Infusions:  sodium chloride 100 mL/hr at 02/26/19 0556   heparin 850 Units/hr (02/26/19 0556)   PRN Meds: hydrALAZINE, HYDROcodone-acetaminophen, nitroGLYCERIN   Vital Signs    Vitals:   02/25/19 1500 02/25/19 1534 02/25/19 2023 02/26/19 0403  BP: (!) 134/55 (!) 124/49 (!) 129/53 (!) 116/51  Pulse: 90 80 65 70  Resp: 19 18 18 16   Temp:   98.6 F (37 C) 99.3 F (37.4 C)  TempSrc:   Oral Oral  SpO2: 98% 99% 95% 97%  Weight:  61.5 kg  63.2 kg  Height:  5\' 5"  (1.651 m)      Intake/Output Summary (Last 24 hours) at 02/26/2019 1121 Last data filed at 02/26/2019 0750 Gross per 24 hour  Intake 1232.9 ml  Output 500 ml  Net 732.9 ml   Last 3 Weights 02/26/2019 02/25/2019 02/25/2019  Weight (lbs) 139 lb 6.4 oz 135 lb 9.6 oz 143 lb  Weight (kg) 63.231 kg 61.508 kg 64.864 kg      Physical Exam   GEN: No acute distress.   Neck: No JVD Cardiac: RRR, no murmurs, rubs, or gallops.  Respiratory: Clear to auscultation bilaterally. GI: Soft, nontender, non-distended  MS: No edema; No deformity. Neuro:  Nonfocal  Psych: Normal affect   Labs    High Sensitivity Troponin:   Recent Labs  Lab 02/25/19 0944 02/25/19 1117  TROPONINIHS 121* 107*      Chemistry Recent Labs  Lab 02/25/19 0944 02/26/19 0347  NA 136 137  K 3.9 4.0  CL 103 108  CO2 22 24  GLUCOSE 91 86  BUN 26* 26*  CREATININE 1.17* 1.20*  CALCIUM 9.7 8.6*  PROT 7.5  --   ALBUMIN 4.5  --   AST 25   --   ALT 17  --   ALKPHOS 68  --   BILITOT 0.9  --   GFRNONAA 44* 43*  GFRAA 51* 50*  ANIONGAP 11 5     Hematology Recent Labs  Lab 02/25/19 0944 02/26/19 0347  WBC 5.7 5.8  RBC 4.31 3.63*  HGB 13.0 11.1*  HCT 39.1 33.5*  MCV 90.7 92.3  MCH 30.2 30.6  MCHC 33.2 33.1  RDW 13.9 14.3  PLT 173 278    BNPNo results for input(s): BNP, PROBNP in the last 168 hours.   DDimer No results for input(s): DDIMER in the last 168 hours.   Radiology    Ct Angio Chest Pe W Or Wo Contrast  Result Date: 02/25/2019 CLINICAL DATA:  78 year old female with history of hypertension and TIA presenting with chest pain and shortness of breath. Concern for pulmonary embolism. EXAM: CT ANGIOGRAPHY CHEST WITH CONTRAST TECHNIQUE: Multidetector CT imaging of the chest was performed using the standard protocol during bolus administration of intravenous contrast. Multiplanar CT image reconstructions and MIPs were obtained to evaluate the vascular anatomy. CONTRAST:  10mL OMNIPAQUE IOHEXOL 350 MG/ML SOLN COMPARISON:  Chest radiograph dated 02/25/2019 FINDINGS: Cardiovascular: There is mild cardiomegaly. No pericardial effusion. There is mild atherosclerotic calcification of the thoracic aorta. No CT evidence of pulmonary embolism. Mediastinum/Nodes: Top-normal hilar lymph nodes and partially calcified left hilar granuloma. The esophagus is grossly unremarkable. No mediastinal fluid collection. Lungs/Pleura: No focal consolidation, pleural effusion, or pneumothorax. Several bilateral pneumatoceles measure up to 3 cm. The central airways are patent. Upper Abdomen: No acute abnormality. Musculoskeletal: Degenerative changes of the spine. No acute osseous pathology. Review of the MIP images confirms the above findings. IMPRESSION: 1. No acute intrathoracic pathology. No CT evidence of pulmonary embolism. 2. Mild cardiomegaly. Aortic Atherosclerosis (ICD10-I70.0). Electronically Signed   By: Anner Crete M.D.   On:  02/25/2019 21:16   Dg Chest Portable 1 View  Result Date: 02/25/2019 CLINICAL DATA:  Chest pain, shortness of breath EXAM: PORTABLE CHEST 1 VIEW COMPARISON:  None. FINDINGS: The heart size and mediastinal contours are within normal limits. Both lungs are clear. Levoscoliosis of the thoracic spine. IMPRESSION: No acute abnormality of the lungs in AP portable projection. Electronically Signed   By: Eddie Candle M.D.   On: 02/25/2019 09:24    Cardiac Studies   IMPRESSIONS    1. Left ventricular ejection fraction, by visual estimation, is 70 to 75%. The left ventricle has hyperdynamic function. Normal left ventricular size. There is mildly increased left ventricular hypertrophy.  2. Left ventricular diastolic Doppler parameters are consistent with impaired relaxation pattern of LV diastolic filling.  3. Global right ventricle has normal systolic function.The right ventricular size is mildly enlarged. No increase in right ventricular wall thickness.  4. Left atrial size was normal.  5. Right atrial size was mildly dilated.  6. Trivial pericardial effusion is present.  7. Mild aortic valve annular calcification.  8. Moderate mitral annular calcification.  9. The mitral valve is abnormal. Trace mitral valve regurgitation. 10. The tricuspid valve is normal in structure. Tricuspid valve regurgitation is trivial. 11. The aortic valve is tricuspid Aortic valve regurgitation was not visualized by color flow Doppler. Mild to moderate aortic valve sclerosis/calcification without any evidence of aortic stenosis. 12. The pulmonic valve was not well visualized. Pulmonic valve regurgitation is not visualized by color flow Doppler. 13. TR signal is inadequate for assessing pulmonary artery systolic pressure. 14. The inferior vena cava is normal in size with greater than 50% respiratory variability, suggesting right atrial pressure of 3 mmHg. 15. Interatrial shunt cannot be excluded. 16. There is redundancy  of the interatrial septum. 17. There is right bowing of the interatrial septum, suggestive of elevated left atrial pressure.   Patient Profile     78 y.o. female with history of hypertension, TIA who presents due to chest pain and shortness of breath.  Assessment & Plan    Her chest CT angiogram did not reveal any pulmonary embolus.  Her echocardiogram had normal ejection fraction and normal filling pressures.  1.  Chest pain -Echo with normal ejection fraction and normal filling pressures -We will obtain a Lexiscan -Further recs pending results of myocardial perfusion imaging test.  If normal patient can be discharged from a cardiac perspective.  2.  History of TIA -Agree with aspirin and Lipitor  3.  History of hypertension -Blood pressures are on the low side. -Hold lisinopril due to low blood pressures.   For questions or updates, please contact [place] Please consult [email or web address] for contact info under        Signed, Kate Sable, MD  02/26/2019, 11:21  AM

## 2019-02-26 NOTE — Progress Notes (Signed)
Discharge instructions explained to pt and pts daughter/ verbalized an understanding / iv and tele removed/ transported off unit via wheelchair.  

## 2019-02-28 LAB — NM MYOCAR MULTI W/SPECT W/WALL MOTION / EF
LV dias vol: 51 mL (ref 46–106)
LV sys vol: 15 mL
Peak HR: 90 {beats}/min
Rest HR: 59 {beats}/min
SDS: 1
SRS: 2
SSS: 2
TID: 1.06

## 2019-02-28 NOTE — Discharge Summary (Signed)
[facility] Physicians - [place] at [facility]   PATIENT NAME: Emma Houston    MR#:  [id number]  DATE OF BIRTH:  19-Aug-1940  DATE OF ADMISSION:  02/25/2019 ADMITTING PHYSICIAN: Jude Ojie, MD  DATE OF DISCHARGE: 02/26/2019  4:00 PM  PRIMARY CARE PHYSICIAN: System, Pcp Not In    ADMISSION DIAGNOSIS:  NSTEMI (non-ST elevated myocardial infarction) (HCC) [I21.4]  DISCHARGE DIAGNOSIS:  Active Problems:   Chest pain   Shortness of breath   Elevated troponin   SECONDARY DIAGNOSIS:   Past Medical History:  Diagnosis Date  . Chronic back pain   . Coronary artery disease   . Hypertension   . Insomnia   . Stroke [facility])     HOSPITAL COURSE:   Pt came with chest pain with slight elevation on troponin, started on heparine drip. Echo was normal. Cardiologist also did stress test which was without acute infarct or findings, so suggested to discharge pt. She was advised that chest pain could be due to non cardiac reasons and should take antacides or some pain meds like tylenol for few days and follow with PMD>  2.  Hypertensive urgency Presented With systolic blood pressure greater than 200. Resume home blood pressure medication with lisinopril.  Added p.o. hydralazine. Being given 20 mg of IV hydralazine as well in the emergency room. stable later.  3.  Prior history of CVA Does not appear to have any residual deficit.  Continue aspirin.  Patient reports she does not tolerate statins.  4.  Sinus bradycardia with heart rate in the 50s Avoid all AV nodal blocking agents.   DISCHARGE CONDITIONS:   Stable.  CONSULTS OBTAINED:  Treatment Team:  Yvonne Kendall, MD  DRUG ALLERGIES:  No Known Allergies  DISCHARGE MEDICATIONS:   Allergies as of 02/26/2019   No Known Allergies     Medication List    STOP taking these medications   lisinopril 20 MG tablet Commonly known as: ZESTRIL     TAKE these medications   aspirin 81 MG EC tablet Take 1  tablet (81 mg total) by mouth daily.   atorvastatin 20 MG tablet Commonly known as: LIPITOR Take 1 tablet (20 mg total) by mouth daily at 6 PM.   carvedilol 3.125 MG tablet Commonly known as: COREG Take 1 tablet (3.125 mg total) by mouth 2 (two) times daily with a meal.   HYDROcodone-acetaminophen 10-325 MG tablet Commonly known as: NORCO Take 1 tablet by mouth every 4 (four) hours as needed for pain.   nitroGLYCERIN 0.4 MG SL tablet Commonly known as: NITROSTAT Place 1 tablet (0.4 mg total) under the tongue every 5 (five) minutes as needed for chest pain.   traZODone 100 MG tablet Commonly known as: DESYREL Take 200 mg by mouth at bedtime.        DISCHARGE INSTRUCTIONS:    Follow with PMD in 1-2 weeks.  If you experience worsening of your admission symptoms, develop shortness of breath, life threatening emergency, suicidal or homicidal thoughts you must seek medical attention immediately by calling 911 or calling your MD immediately  if symptoms less severe.  You Must read complete instructions/literature along with all the possible adverse reactions/side effects for all the Medicines you take and that have been prescribed to you. Take any new Medicines after you have completely understood and accept all the possible adverse reactions/side effects.   Please note  You were cared for by a hospitalist during your hospital stay. If you have any questions about  your discharge medications or the care you received while you were in the hospital after you are discharged, you can call the unit and asked to speak with the hospitalist on call if the hospitalist that took care of you is not available. Once you are discharged, your primary care physician will handle any further medical issues. Please note that NO REFILLS for any discharge medications will be authorized once you are discharged, as it is imperative that you return to your primary care physician (or establish a relationship  with a primary care physician if you do not have one) for your aftercare needs so that they can reassess your need for medications and monitor your lab values.    Today   CHIEF COMPLAINT:   Chief Complaint  Patient presents with  . Chest Pain  . Shortness of Breath    HISTORY OF PRESENT ILLNESS:  Emma Houston  is a 78 y.o. female with a known history of coronary artery disease, hypertension and CVA who recently moved from [place] and living with daughter here in town and was brought to the emergency room with complaints of chest pain and shortness of breath that started since yesterday.  Severity was 5/10.  Already improved significantly at the time of my evaluation.  Chest pain located on the left side of the chest.  Nonradiating.  Some associated shortness of breath.  Slightly reproducible by palpation.  Not pleuritic.  Patient was evaluated in the emergency room.  Troponin level elevated at 121 twelve-lead EKG done reviewed revealed sinus bradycardia with rate of 57.  Emergency room provider discussed with cardiologist on-call Dr. Okey DupreEnd.  Patient already started on heparin drip.  Medical service called to admit for further evaluation and management.   VITAL SIGNS:  Blood pressure (!) 140/51, pulse 72, temperature 99.3 F (37.4 C), temperature source Oral, resp. rate 18, height 5\' 5"  (1.651 m), weight 63.2 kg, SpO2 100 %.  I/O:  No intake or output data in the 24 hours ending 02/28/19 2246  PHYSICAL EXAMINATION:  GENERAL:  78 y.o.-year-old patient lying in the bed with no acute distress.  EYES: Pupils equal, round, reactive to light and accommodation. No scleral icterus. Extraocular muscles intact.  HEENT: Head atraumatic, normocephalic. Oropharynx and nasopharynx clear.  NECK:  Supple, no jugular venous distention. No thyroid enlargement, no tenderness.  LUNGS: Normal breath sounds bilaterally, no wheezing, rales,rhonchi or crepitation. No use of accessory muscles of  respiration.  CARDIOVASCULAR: S1, S2 normal. No murmurs, rubs, or gallops.  ABDOMEN: Soft, non-tender, non-distended. Bowel sounds present. No organomegaly or mass.  EXTREMITIES: No pedal edema, cyanosis, or clubbing.  NEUROLOGIC: Cranial nerves II through XII are intact. Muscle strength 5/5 in all extremities. Sensation intact. Gait not checked.  PSYCHIATRIC: The patient is alert and oriented x 3.  SKIN: No obvious rash, lesion, or ulcer.   DATA REVIEW:   CBC Recent Labs  Lab 02/26/19 0347  WBC 5.8  HGB 11.1*  HCT 33.5*  PLT 278    Chemistries  Recent Labs  Lab 02/25/19 0944  02/26/19 0347  NA 136  --  137  K 3.9  --  4.0  CL 103  --  108  CO2 22  --  24  GLUCOSE 91  --  86  BUN 26*  --  26*  CREATININE 1.17*  --  1.20*  CALCIUM 9.7  --  8.6*  MG  --    < > 2.1  AST 25  --   --  ALT 17  --   --   ALKPHOS 68  --   --   BILITOT 0.9  --   --    < > = values in this interval not displayed.    Cardiac Enzymes No results for input(s): TROPONINI in the last 168 hours.  Microbiology Results  Results for orders placed or performed during the hospital encounter of 02/25/19  SARS Coronavirus 2 [facility] order, Performed in [facility] hospital lab) Nasopharyngeal Nasopharyngeal Swab     Status: None   Collection Time: 02/25/19 10:32 AM   Specimen: Nasopharyngeal Swab  Result Value Ref Range Status   SARS Coronavirus 2 NEGATIVE NEGATIVE Final    Comment: (NOTE) If result is NEGATIVE SARS-CoV-2 target nucleic acids are NOT DETECTED. The SARS-CoV-2 RNA is generally detectable in upper and lower  respiratory specimens during the acute phase of infection. The lowest  concentration of SARS-CoV-2 viral copies this assay can detect is 250  copies / mL. A negative result does not preclude SARS-CoV-2 infection  and should not be used as the sole basis for treatment or other  patient management decisions.  A negative result may occur with  improper specimen collection /  handling, submission of specimen other  than nasopharyngeal swab, presence of viral mutation(s) within the  areas targeted by this assay, and inadequate number of viral copies  (<250 copies / mL). A negative result must be combined with clinical  observations, patient history, and epidemiological information. If result is POSITIVE SARS-CoV-2 target nucleic acids are DETECTED. The SARS-CoV-2 RNA is generally detectable in upper and lower  respiratory specimens dur ing the acute phase of infection.  Positive  results are indicative of active infection with SARS-CoV-2.  Clinical  correlation with patient history and other diagnostic information is  necessary to determine patient infection status.  Positive results do  not rule out bacterial infection or co-infection with other viruses. If result is PRESUMPTIVE POSTIVE SARS-CoV-2 nucleic acids MAY BE PRESENT.   A presumptive positive result was obtained on the submitted specimen  and confirmed on repeat testing.  While 2019 novel coronavirus  (SARS-CoV-2) nucleic acids may be present in the submitted sample  additional confirmatory testing may be necessary for epidemiological  and / or clinical management purposes  to differentiate between  SARS-CoV-2 and other Sarbecovirus currently known to infect humans.  If clinically indicated additional testing with an alternate test  methodology [phone]) is advised. The SARS-CoV-2 RNA is generally  detectable in upper and lower respiratory sp ecimens during the acute  phase of infection. The expected result is Negative. Fact Sheet for Patients:  BoilerBrush.com.cy Fact Sheet for Healthcare Providers: https://pope.com/ This test is not yet approved or cleared by the [place] FDA and has been authorized for detection and/or diagnosis of SARS-CoV-2 by FDA under an Emergency Use Authorization (EUA).  This EUA will remain in effect (meaning this  test can be used) for the duration of the COVID-19 declaration under Section 564(b)(1) of the Act, 21 U.S.C. section 360bbb-3(b)(1), unless the authorization is terminated or revoked sooner. Performed at [facility], [address] [place], [place] [address]     RADIOLOGY:  No results found.  EKG:   Orders placed or performed during the hospital encounter of 02/25/19  . EKG 12-Lead  . EKG 12-Lead  . EKG 12-Lead  . EKG 12-Lead      Management plans discussed with the patient, family and they are in agreement.  CODE STATUS:  Code  Status History    Date Active Date Inactive Code Status Order ID Comments User Context   02/25/2019 1111 02/26/2019 1906 Full Code [id number]  Otila Back, MD ED   Advance Care Planning Activity      TOTAL TIME TAKING CARE OF THIS PATIENT: 35 minutes.    Vaughan Basta M.D on 02/28/2019 at 10:46 PM  Between 7am to 6pm - Pager - [phone]  After 6pm go to [email or web address] - password [email or web address] [facility]  Office  [phone]  CC: Primary care physician; System, Pcp Not In   Note: This dictation was prepared with Dragon dictation along with smaller phrase technology. Any transcriptional errors that result from this process are unintentional.

## 2019-03-27 ENCOUNTER — Ambulatory Visit
Admission: RE | Admit: 2019-03-27 | Discharge: 2019-03-27 | Disposition: A | Discharge status: Home / Self Care | Payer: Medicare Other | Attending: Neurosurgery | Admitting: Neurosurgery

## 2019-03-27 ENCOUNTER — Other Ambulatory Visit: Payer: Self-pay | Admitting: Neurosurgery

## 2019-03-27 ENCOUNTER — Ambulatory Visit
Admission: RE | Admit: 2019-03-27 | Discharge: 2019-03-27 | Disposition: A | Discharge status: Home / Self Care | Payer: Medicare Other | Source: Ambulatory Visit | Attending: Neurosurgery | Admitting: Neurosurgery

## 2019-03-27 DIAGNOSIS — M419 Scoliosis, unspecified: Secondary | ICD-10-CM | POA: Diagnosis not present

## 2019-03-27 DIAGNOSIS — M5416 Radiculopathy, lumbar region: Secondary | ICD-10-CM | POA: Insufficient documentation

## 2019-06-04 DIAGNOSIS — F419 Anxiety disorder, unspecified: Secondary | ICD-10-CM | POA: Insufficient documentation

## 2019-06-04 DIAGNOSIS — M25361 Other instability, right knee: Secondary | ICD-10-CM | POA: Insufficient documentation

## 2019-06-04 DIAGNOSIS — Z8673 Personal history of transient ischemic attack (TIA), and cerebral infarction without residual deficits: Secondary | ICD-10-CM | POA: Insufficient documentation

## 2019-06-05 DIAGNOSIS — Z9181 History of falling: Secondary | ICD-10-CM | POA: Insufficient documentation

## 2019-06-05 DIAGNOSIS — D509 Iron deficiency anemia, unspecified: Secondary | ICD-10-CM | POA: Insufficient documentation

## 2019-06-05 DIAGNOSIS — Z9189 Other specified personal risk factors, not elsewhere classified: Secondary | ICD-10-CM | POA: Insufficient documentation

## 2019-06-05 DIAGNOSIS — N1831 Chronic kidney disease, stage 3a: Secondary | ICD-10-CM | POA: Insufficient documentation

## 2019-06-05 DIAGNOSIS — E871 Hypo-osmolality and hyponatremia: Secondary | ICD-10-CM

## 2019-06-05 HISTORY — DX: Hypo-osmolality and hyponatremia: E87.1

## 2019-06-17 DIAGNOSIS — M19071 Primary osteoarthritis, right ankle and foot: Secondary | ICD-10-CM | POA: Insufficient documentation

## 2019-08-18 DIAGNOSIS — Z471 Aftercare following joint replacement surgery: Secondary | ICD-10-CM | POA: Insufficient documentation

## 2019-08-18 DIAGNOSIS — Z96651 Presence of right artificial knee joint: Secondary | ICD-10-CM | POA: Insufficient documentation

## 2019-09-02 DIAGNOSIS — M25551 Pain in right hip: Secondary | ICD-10-CM | POA: Insufficient documentation

## 2019-09-02 DIAGNOSIS — M542 Cervicalgia: Secondary | ICD-10-CM | POA: Insufficient documentation

## 2019-09-02 DIAGNOSIS — M25552 Pain in left hip: Secondary | ICD-10-CM | POA: Insufficient documentation

## 2019-09-02 DIAGNOSIS — G8929 Other chronic pain: Secondary | ICD-10-CM | POA: Insufficient documentation

## 2019-09-02 HISTORY — DX: Pain in right hip: M25.551

## 2019-09-19 ENCOUNTER — Other Ambulatory Visit: Payer: Self-pay | Admitting: Neurosurgery

## 2019-10-31 ENCOUNTER — Other Ambulatory Visit: Payer: Self-pay

## 2019-10-31 ENCOUNTER — Encounter
Admission: RE | Admit: 2019-10-31 | Discharge: 2019-10-31 | Disposition: A | Discharge status: Home / Self Care | Payer: Medicare Other | Source: Ambulatory Visit | Attending: Neurosurgery | Admitting: Neurosurgery

## 2019-10-31 DIAGNOSIS — Z01818 Encounter for other preprocedural examination: Secondary | ICD-10-CM | POA: Insufficient documentation

## 2019-10-31 DIAGNOSIS — I10 Essential (primary) hypertension: Secondary | ICD-10-CM | POA: Insufficient documentation

## 2019-10-31 HISTORY — DX: Unspecified osteoarthritis, unspecified site: M19.90

## 2019-10-31 HISTORY — DX: Hyperlipidemia, unspecified: E78.5

## 2019-10-31 HISTORY — DX: Depression, unspecified: F32.A

## 2019-10-31 HISTORY — DX: Chronic kidney disease, unspecified: N18.9

## 2019-10-31 HISTORY — DX: Gastro-esophageal reflux disease without esophagitis: K21.9

## 2019-10-31 HISTORY — DX: Anemia, unspecified: D64.9

## 2019-10-31 NOTE — Patient Instructions (Signed)
Your procedure is scheduled on: Wed. 6/16 Report to Day Surgery.[facility] To find out your arrival time please call [phone] between 1PM - 3PM on Tues 6/15  Remember: Instructions that are not followed completely may result in serious medical risk,  up to and including death, or upon the discretion of your surgeon and anesthesiologist your  surgery may need to be rescheduled.     _X__ 1. Do not eat food after midnight the night before your procedure.                 No gum chewing or hard candies. You may drink clear liquids up to 2 hours                 before you are scheduled to arrive for your surgery- DO not drink clear                 liquids within 2 hours of the start of your surgery.                 Clear Liquids include:  water, apple juice without pulp, clear Gatorade, G2 or                  Gatorade Zero (avoid Red/Purple/Blue), Black Coffee or Tea (Do not add                 anything to coffee or tea). _____2.   Complete the carbohydrate drink provided to you, 2 hours before arrival.  __X__2.  On the morning of surgery brush your teeth with toothpaste and water, you                may rinse your mouth with mouthwash if you wish.  Do not swallow any toothpaste of mouthwash.     _X__ 3.  No Alcohol for 24 hours before or after surgery.   ___ 4.  Do Not Smoke or use e-cigarettes For 24 Hours Prior to Your Surgery.                 Do not use any chewable tobacco products for at least 6 hours prior to                 Surgery.  ___  5.  Do not use any recreational drugs (marijuana, cocaine, heroin, ecstacy, MDMA or other)                For at least one week prior to your surgery.  Combination of these drugs with anesthesia                May have life threatening results.  ____  6.  Bring all medications with you on the day of surgery if instructed.   ___x_  7.  Notify your doctor if there is any change in your medical condition      (cold,  fever, infections).     Do not wear jewelry, make-up, hairpins, clips or nail polish. Do not wear lotions, powders, or perfumes. You may wear deodorant. Do not shave 48 hours prior to surgery. . Do not bring valuables to the hospital.    [facility] is not responsible for any belongings or valuables.  Contacts, dentures or bridgework may not be worn into surgery. Leave your suitcase in the car. After surgery it may be brought to your room. For patients admitted to the hospital, discharge time is determined by your treatment team.   Patients  discharged the day of surgery will not be allowed to drive home.   Make arrangements for someone to be with you for the first 24 hours of your Same Day Discharge.    Please read over the following fact sheets that you were given:     __x__ Take these medicines the morning of surgery with A SIP OF WATER:    1. buPROPion (WELLBUTRIN XL) 300 MG 24 hr tablet  2. carvedilol (COREG) 3.125 MG tablet  3. HYDROcodone-acetaminophen (NORCO) 10-325 MG tablet  If needed  4.  5.  6.  ____ Fleet Enema (as directed)   _x___ Use (or wipes) as directed  ____ Use Benzoyl Peroxide Gel as instructed  ____ Use inhalers on the day of surgery  ____ Stop metformin 2 days prior to surgery    ____ Take 1/2 of usual insulin dose the night before surgery. No insulin the morning          of surgery.   __x__ Stop aspirin on 6/10  ____ Stop Anti-inflammatories    ____ Stop supplements until after surgery.    ____ Bring C-Pap to the hospital.

## 2019-11-04 ENCOUNTER — Other Ambulatory Visit: Payer: Self-pay

## 2019-11-04 ENCOUNTER — Encounter
Admission: RE | Admit: 2019-11-04 | Discharge: 2019-11-04 | Disposition: A | Discharge status: Home / Self Care | Payer: Medicare Other | Source: Ambulatory Visit | Attending: Neurosurgery | Admitting: Neurosurgery

## 2019-11-04 DIAGNOSIS — Z8673 Personal history of transient ischemic attack (TIA), and cerebral infarction without residual deficits: Secondary | ICD-10-CM | POA: Insufficient documentation

## 2019-11-04 DIAGNOSIS — Z01818 Encounter for other preprocedural examination: Secondary | ICD-10-CM | POA: Diagnosis present

## 2019-11-04 DIAGNOSIS — I10 Essential (primary) hypertension: Secondary | ICD-10-CM | POA: Insufficient documentation

## 2019-11-04 DIAGNOSIS — N189 Chronic kidney disease, unspecified: Secondary | ICD-10-CM | POA: Insufficient documentation

## 2019-11-04 DIAGNOSIS — K219 Gastro-esophageal reflux disease without esophagitis: Secondary | ICD-10-CM | POA: Insufficient documentation

## 2019-11-04 DIAGNOSIS — I129 Hypertensive chronic kidney disease with stage 1 through stage 4 chronic kidney disease, or unspecified chronic kidney disease: Secondary | ICD-10-CM | POA: Insufficient documentation

## 2019-11-04 DIAGNOSIS — E785 Hyperlipidemia, unspecified: Secondary | ICD-10-CM | POA: Insufficient documentation

## 2019-11-04 DIAGNOSIS — I2510 Atherosclerotic heart disease of native coronary artery without angina pectoris: Secondary | ICD-10-CM | POA: Insufficient documentation

## 2019-11-04 LAB — BASIC METABOLIC PANEL
Anion gap: 7 (ref 5–15)
BUN: 31 mg/dL — ABNORMAL HIGH (ref 8–23)
CO2: 29 mmol/L (ref 22–32)
Calcium: 9.2 mg/dL (ref 8.9–10.3)
Chloride: 101 mmol/L (ref 98–111)
Creatinine, Ser: 1.46 mg/dL — ABNORMAL HIGH (ref 0.44–1.00)
GFR calc Af Amer: 39 mL/min — ABNORMAL LOW (ref >60)
GFR calc non Af Amer: 34 mL/min — ABNORMAL LOW (ref >60)
Glucose, Bld: 98 mg/dL (ref 70–99)
Potassium: 3.7 mmol/L (ref 3.5–5.1)
Sodium: 137 mmol/L (ref 135–145)

## 2019-11-04 LAB — URINALYSIS, ROUTINE W REFLEX MICROSCOPIC
Bilirubin Urine: NEGATIVE
Glucose, UA: NEGATIVE mg/dL
Hgb urine dipstick: NEGATIVE
Ketones, ur: NEGATIVE mg/dL
Nitrite: NEGATIVE
Protein, ur: NEGATIVE mg/dL
Specific Gravity, Urine: 1.012 (ref 1.005–1.030)
pH: 6 (ref 5.0–8.0)

## 2019-11-04 LAB — TYPE AND SCREEN
ABO/RH(D): AB NEG
Antibody Screen: NEGATIVE

## 2019-11-04 LAB — CBC
HCT: 36.2 % (ref 36.0–46.0)
Hemoglobin: 11.8 g/dL — ABNORMAL LOW (ref 12.0–15.0)
MCH: 30.8 pg (ref 26.0–34.0)
MCHC: 32.6 g/dL (ref 30.0–36.0)
MCV: 94.5 fL (ref 80.0–100.0)
Platelets: 248 10*3/uL (ref 150–400)
RBC: 3.83 MIL/uL — ABNORMAL LOW (ref 3.87–5.11)
RDW: 13.6 % (ref 11.5–15.5)
WBC: 5.5 10*3/uL (ref 4.0–10.5)
nRBC: 0 % (ref 0.0–0.2)

## 2019-11-04 LAB — SURGICAL PCR SCREEN
MRSA, PCR: NEGATIVE
Staphylococcus aureus: NEGATIVE

## 2019-11-04 LAB — APTT: aPTT: 28 seconds (ref 24–36)

## 2019-11-04 LAB — PROTIME-INR
INR: 1 (ref 0.8–1.2)
Prothrombin Time: 13.1 seconds (ref 11.4–15.2)

## 2019-11-10 ENCOUNTER — Other Ambulatory Visit: Payer: Self-pay

## 2019-11-10 ENCOUNTER — Other Ambulatory Visit
Admission: RE | Admit: 2019-11-10 | Discharge: 2019-11-10 | Disposition: A | Discharge status: Home / Self Care | Payer: Medicare Other | Source: Ambulatory Visit | Attending: Neurosurgery | Admitting: Neurosurgery

## 2019-11-10 DIAGNOSIS — Z20822 Contact with and (suspected) exposure to covid-19: Secondary | ICD-10-CM | POA: Diagnosis not present

## 2019-11-10 DIAGNOSIS — Z01812 Encounter for preprocedural laboratory examination: Secondary | ICD-10-CM | POA: Insufficient documentation

## 2019-11-11 LAB — SARS CORONAVIRUS 2 (TAT 6-24 HRS): SARS Coronavirus 2: NEGATIVE

## 2019-11-12 ENCOUNTER — Ambulatory Visit
Admission: RE | Admit: 2019-11-12 | Discharge: 2019-11-12 | Disposition: A | Discharge status: Home / Self Care | Payer: Medicare Other | Attending: Neurosurgery | Admitting: Neurosurgery

## 2019-11-12 ENCOUNTER — Ambulatory Visit: Payer: Medicare Other | Admitting: Anesthesiology

## 2019-11-12 ENCOUNTER — Other Ambulatory Visit: Payer: Self-pay

## 2019-11-12 ENCOUNTER — Encounter: Payer: Self-pay | Admitting: Neurosurgery

## 2019-11-12 ENCOUNTER — Encounter
Admission: RE | Disposition: A | Discharge status: Home / Self Care | Payer: Self-pay | Source: Home / Self Care | Attending: Neurosurgery

## 2019-11-12 ENCOUNTER — Ambulatory Visit: Payer: Medicare Other

## 2019-11-12 DIAGNOSIS — Z79899 Other long term (current) drug therapy: Secondary | ICD-10-CM | POA: Insufficient documentation

## 2019-11-12 DIAGNOSIS — Z8673 Personal history of transient ischemic attack (TIA), and cerebral infarction without residual deficits: Secondary | ICD-10-CM | POA: Insufficient documentation

## 2019-11-12 DIAGNOSIS — M48062 Spinal stenosis, lumbar region with neurogenic claudication: Secondary | ICD-10-CM | POA: Insufficient documentation

## 2019-11-12 DIAGNOSIS — Z7982 Long term (current) use of aspirin: Secondary | ICD-10-CM | POA: Diagnosis not present

## 2019-11-12 DIAGNOSIS — I129 Hypertensive chronic kidney disease with stage 1 through stage 4 chronic kidney disease, or unspecified chronic kidney disease: Secondary | ICD-10-CM | POA: Insufficient documentation

## 2019-11-12 DIAGNOSIS — K219 Gastro-esophageal reflux disease without esophagitis: Secondary | ICD-10-CM | POA: Insufficient documentation

## 2019-11-12 DIAGNOSIS — Z87891 Personal history of nicotine dependence: Secondary | ICD-10-CM | POA: Diagnosis not present

## 2019-11-12 DIAGNOSIS — N189 Chronic kidney disease, unspecified: Secondary | ICD-10-CM | POA: Insufficient documentation

## 2019-11-12 DIAGNOSIS — Z96653 Presence of artificial knee joint, bilateral: Secondary | ICD-10-CM | POA: Insufficient documentation

## 2019-11-12 DIAGNOSIS — M069 Rheumatoid arthritis, unspecified: Secondary | ICD-10-CM | POA: Diagnosis not present

## 2019-11-12 DIAGNOSIS — F329 Major depressive disorder, single episode, unspecified: Secondary | ICD-10-CM | POA: Insufficient documentation

## 2019-11-12 DIAGNOSIS — M1990 Unspecified osteoarthritis, unspecified site: Secondary | ICD-10-CM | POA: Diagnosis not present

## 2019-11-12 DIAGNOSIS — E785 Hyperlipidemia, unspecified: Secondary | ICD-10-CM | POA: Diagnosis not present

## 2019-11-12 DIAGNOSIS — Z419 Encounter for procedure for purposes other than remedying health state, unspecified: Secondary | ICD-10-CM

## 2019-11-12 HISTORY — PX: LUMBAR LAMINECTOMY/DECOMPRESSION MICRODISCECTOMY: SHX5026

## 2019-11-12 LAB — ABO/RH: ABO/RH(D): AB NEG

## 2019-11-12 SURGERY — LUMBAR LAMINECTOMY/DECOMPRESSION MICRODISCECTOMY 2 LEVELS
Anesthesia: General

## 2019-11-12 MED ORDER — ACETAMINOPHEN 10 MG/ML IV SOLN
INTRAVENOUS | Status: AC
  Filled 2019-11-12: qty 100

## 2019-11-12 MED ORDER — CEFAZOLIN SODIUM-DEXTROSE 1-4 GM/50ML-% IV SOLN
INTRAVENOUS | Status: AC
  Filled 2019-11-12: qty 50

## 2019-11-12 MED ORDER — REMIFENTANIL HCL 1 MG IV SOLR
INTRAVENOUS | Status: AC
  Filled 2019-11-12: qty 1000

## 2019-11-12 MED ORDER — LACTATED RINGERS IV SOLN: INTRAVENOUS | Status: DC

## 2019-11-12 MED ORDER — ACETAMINOPHEN 10 MG/ML IV SOLN
INTRAVENOUS | Status: DC | PRN
  Administered 2019-11-12: 1000 mg via INTRAVENOUS

## 2019-11-12 MED ORDER — PROPOFOL 10 MG/ML IV BOLUS
INTRAVENOUS | Status: DC | PRN
  Administered 2019-11-12: 120 mg via INTRAVENOUS

## 2019-11-12 MED ORDER — GLYCOPYRROLATE 0.2 MG/ML IJ SOLN
INTRAMUSCULAR | Status: AC
  Filled 2019-11-12: qty 1

## 2019-11-12 MED ORDER — ONDANSETRON HCL 4 MG/2ML IJ SOLN
INTRAMUSCULAR | Status: DC | PRN
  Administered 2019-11-12: 4 mg via INTRAVENOUS

## 2019-11-12 MED ORDER — DEXAMETHASONE SODIUM PHOSPHATE 10 MG/ML IJ SOLN
INTRAMUSCULAR | Status: AC
  Filled 2019-11-12: qty 1

## 2019-11-12 MED ORDER — FAMOTIDINE 20 MG PO TABS
ORAL_TABLET | ORAL | Status: AC
  Filled 2019-11-12: qty 1

## 2019-11-12 MED ORDER — CEFAZOLIN SODIUM-DEXTROSE 1-4 GM/50ML-% IV SOLN
1.0000 g | Freq: Once | INTRAVENOUS | Status: AC
  Administered 2019-11-12: 2 g via INTRAVENOUS

## 2019-11-12 MED ORDER — FAMOTIDINE 20 MG PO TABS
20.0000 mg | ORAL_TABLET | Freq: Once | ORAL | Status: AC
  Administered 2019-11-12: 20 mg via ORAL

## 2019-11-12 MED ORDER — ORAL CARE MOUTH RINSE: 15.0000 mL | Freq: Once | OROMUCOSAL | Status: AC

## 2019-11-12 MED ORDER — ONDANSETRON HCL 4 MG/2ML IJ SOLN
INTRAMUSCULAR | Status: AC
  Filled 2019-11-12: qty 2

## 2019-11-12 MED ORDER — ACETAMINOPHEN 10 MG/ML IV SOLN: 1000.0000 mg | Freq: Once | INTRAVENOUS | Status: DC | PRN

## 2019-11-12 MED ORDER — SODIUM CHLORIDE 0.9 % IV SOLN
INTRAVENOUS | Status: DC | PRN
  Administered 2019-11-12: 60 mL

## 2019-11-12 MED ORDER — EPHEDRINE 5 MG/ML INJ
INTRAVENOUS | Status: AC
  Filled 2019-11-12: qty 10

## 2019-11-12 MED ORDER — FENTANYL CITRATE (PF) 100 MCG/2ML IJ SOLN
INTRAMUSCULAR | Status: AC
  Filled 2019-11-12: qty 2

## 2019-11-12 MED ORDER — SUCCINYLCHOLINE CHLORIDE 200 MG/10ML IV SOSY
PREFILLED_SYRINGE | INTRAVENOUS | Status: AC
  Filled 2019-11-12: qty 10

## 2019-11-12 MED ORDER — LIDOCAINE HCL (PF) 2 % IJ SOLN
INTRAMUSCULAR | Status: AC
  Filled 2019-11-12: qty 5

## 2019-11-12 MED ORDER — LIDOCAINE HCL (CARDIAC) PF 100 MG/5ML IV SOSY
PREFILLED_SYRINGE | INTRAVENOUS | Status: DC | PRN
  Administered 2019-11-12: 60 mg via INTRAVENOUS

## 2019-11-12 MED ORDER — OXYCODONE HCL 5 MG PO TABS: 5.0000 mg | ORAL_TABLET | ORAL | 0 refills | Status: AC | PRN

## 2019-11-12 MED ORDER — BUPIVACAINE HCL (PF) 0.5 % IJ SOLN
INTRAMUSCULAR | Status: DC | PRN
  Administered 2019-11-12: 20 mL

## 2019-11-12 MED ORDER — FENTANYL CITRATE (PF) 100 MCG/2ML IJ SOLN
25.0000 ug | INTRAMUSCULAR | Status: DC | PRN
  Administered 2019-11-12 (×2): 50 ug via INTRAVENOUS

## 2019-11-12 MED ORDER — DEXAMETHASONE SODIUM PHOSPHATE 10 MG/ML IJ SOLN
INTRAMUSCULAR | Status: DC | PRN
  Administered 2019-11-12: 5 mg via INTRAVENOUS

## 2019-11-12 MED ORDER — BUPIVACAINE-EPINEPHRINE (PF) 0.5% -1:200000 IJ SOLN
INTRAMUSCULAR | Status: DC | PRN
  Administered 2019-11-12: 4 mL

## 2019-11-12 MED ORDER — METHYLPREDNISOLONE ACETATE 40 MG/ML IJ SUSP
INTRAMUSCULAR | Status: DC | PRN
  Administered 2019-11-12: 40 mg

## 2019-11-12 MED ORDER — SUCCINYLCHOLINE CHLORIDE 20 MG/ML IJ SOLN
INTRAMUSCULAR | Status: DC | PRN
  Administered 2019-11-12: 80 mg via INTRAVENOUS

## 2019-11-12 MED ORDER — CEFAZOLIN SODIUM 1 G IJ SOLR
INTRAMUSCULAR | Status: AC
  Filled 2019-11-12: qty 10

## 2019-11-12 MED ORDER — CHLORHEXIDINE GLUCONATE 0.12 % MT SOLN
15.0000 mL | Freq: Once | OROMUCOSAL | Status: AC
  Administered 2019-11-12: 15 mL via OROMUCOSAL

## 2019-11-12 MED ORDER — THROMBIN 5000 UNITS EX SOLR
CUTANEOUS | Status: DC | PRN
  Administered 2019-11-12: 5000 [IU] via TOPICAL

## 2019-11-12 MED ORDER — EPHEDRINE SULFATE 50 MG/ML IJ SOLN
INTRAMUSCULAR | Status: DC | PRN
  Administered 2019-11-12 (×2): 10 mg via INTRAVENOUS

## 2019-11-12 MED ORDER — FENTANYL CITRATE (PF) 250 MCG/5ML IJ SOLN
INTRAMUSCULAR | Status: AC
  Filled 2019-11-12: qty 5

## 2019-11-12 MED ORDER — ONDANSETRON HCL 4 MG/2ML IJ SOLN: 4.0000 mg | Freq: Once | INTRAMUSCULAR | Status: DC | PRN

## 2019-11-12 MED ORDER — REMIFENTANIL HCL 1 MG IV SOLR
INTRAVENOUS | Status: DC | PRN
  Administered 2019-11-12: .1 ug/kg/min via INTRAVENOUS

## 2019-11-12 MED ORDER — GLYCOPYRROLATE 0.2 MG/ML IJ SOLN
INTRAMUSCULAR | Status: DC | PRN
  Administered 2019-11-12: .2 mg via INTRAVENOUS

## 2019-11-12 MED ORDER — PHENYLEPHRINE HCL (PRESSORS) 10 MG/ML IV SOLN
INTRAVENOUS | Status: AC
  Filled 2019-11-12: qty 1

## 2019-11-12 MED ORDER — CHLORHEXIDINE GLUCONATE 0.12 % MT SOLN
OROMUCOSAL | Status: AC
  Filled 2019-11-12: qty 15

## 2019-11-12 MED ORDER — OXYCODONE HCL 5 MG/5ML PO SOLN: 5.0000 mg | Freq: Once | ORAL | Status: DC | PRN

## 2019-11-12 MED ORDER — OXYCODONE HCL 5 MG PO TABS: 5.0000 mg | ORAL_TABLET | Freq: Once | ORAL | Status: DC | PRN

## 2019-11-12 MED ORDER — METHOCARBAMOL 500 MG PO TABS: 500.0000 mg | ORAL_TABLET | Freq: Three times a day (TID) | ORAL | 0 refills | Status: AC | PRN

## 2019-11-12 MED ORDER — SODIUM CHLORIDE 0.9 % IR SOLN
Status: DC | PRN
  Administered 2019-11-12: 1000 mL

## 2019-11-12 MED ORDER — PROPOFOL 10 MG/ML IV BOLUS
INTRAVENOUS | Status: AC
  Filled 2019-11-12: qty 40

## 2019-11-12 MED ORDER — FENTANYL CITRATE (PF) 100 MCG/2ML IJ SOLN
INTRAMUSCULAR | Status: DC | PRN
  Administered 2019-11-12: 75 ug via INTRAVENOUS
  Administered 2019-11-12: 50 ug via INTRAVENOUS

## 2019-11-12 SURGICAL SUPPLY — 54 items
BUR NEURO DRILL SOFT 3.0X3.8M (BURR) ×2 IMPLANT
CANISTER SUCT 1200ML W/VALVE (MISCELLANEOUS) ×4 IMPLANT
CHLORAPREP W/TINT 26 (MISCELLANEOUS) ×2 IMPLANT
CNTNR SPEC 2.5X3XGRAD LEK (MISCELLANEOUS) ×1
CONT SPEC 4OZ STER OR WHT (MISCELLANEOUS) ×1
CONTAINER SPEC 2.5X3XGRAD LEK (MISCELLANEOUS) ×1 IMPLANT
COUNTER NEEDLE 20/40 LG (NEEDLE) ×2 IMPLANT
COVER LIGHT HANDLE STERIS (MISCELLANEOUS) ×4 IMPLANT
COVER WAND RF STERILE (DRAPES) ×2 IMPLANT
CUP MEDICINE 2OZ PLAST GRAD ST (MISCELLANEOUS) ×2 IMPLANT
DERMABOND ADVANCED (GAUZE/BANDAGES/DRESSINGS) ×1
DERMABOND ADVANCED .7 DNX12 (GAUZE/BANDAGES/DRESSINGS) ×1 IMPLANT
DRAPE C-ARM 42X72 X-RAY (DRAPES) ×4 IMPLANT
DRAPE LAPAROTOMY 100X77 ABD (DRAPES) ×2 IMPLANT
DRAPE MICROSCOPE SPINE 48X150 (DRAPES) ×2 IMPLANT
DRAPE POUCH INSTRU U-SHP 10X18 (DRAPES) IMPLANT
DRAPE SURG 17X11 SM STRL (DRAPES) ×8 IMPLANT
ELECT CAUTERY BLADE TIP 2.5 (TIP) ×2
ELECT EZSTD 165MM 6.5IN (MISCELLANEOUS) ×2
ELECT REM PT RETURN 9FT ADLT (ELECTROSURGICAL) ×2
ELECTRODE CAUTERY BLDE TIP 2.5 (TIP) ×1 IMPLANT
ELECTRODE EZSTD 165MM 6.5IN (MISCELLANEOUS) ×1 IMPLANT
ELECTRODE REM PT RTRN 9FT ADLT (ELECTROSURGICAL) ×1 IMPLANT
FRAME EYE SHIELD (PROTECTIVE WEAR) ×2 IMPLANT
GLOVE BIOGEL PI IND STRL 7.0 (GLOVE) ×1 IMPLANT
GLOVE BIOGEL PI INDICATOR 7.0 (GLOVE) ×1
GLOVE SURG SYN 7.0 (GLOVE) ×4 IMPLANT
GLOVE SURG SYN 8.5  E (GLOVE) ×3
GLOVE SURG SYN 8.5 E (GLOVE) ×3 IMPLANT
GOWN SRG XL LVL 3 NONREINFORCE (GOWNS) ×1 IMPLANT
GOWN STRL NON-REIN TWL XL LVL3 (GOWNS) ×1
GOWN STRL REUS W/ TWL XL LVL3 (GOWN DISPOSABLE) ×1 IMPLANT
GOWN STRL REUS W/TWL MED LVL3 (GOWN DISPOSABLE) ×2 IMPLANT
GOWN STRL REUS W/TWL XL LVL3 (GOWN DISPOSABLE) ×1
GRADUATE 1200CC STRL 31836 (MISCELLANEOUS) ×2 IMPLANT
KIT SPINAL PRONEVIEW (KITS) ×2 IMPLANT
KNIFE BAYONET SHORT DISCETOMY (MISCELLANEOUS) IMPLANT
MARKER SKIN DUAL TIP RULER LAB (MISCELLANEOUS) ×4 IMPLANT
NDL SAFETY ECLIPSE 18X1.5 (NEEDLE) ×1 IMPLANT
NEEDLE HYPO 18GX1.5 SHARP (NEEDLE) ×1
NEEDLE HYPO 22GX1.5 SAFETY (NEEDLE) ×2 IMPLANT
NS IRRIG 1000ML POUR BTL (IV SOLUTION) ×2 IMPLANT
PACK LAMINECTOMY NEURO (CUSTOM PROCEDURE TRAY) ×2 IMPLANT
PAD ARMBOARD 7.5X6 YLW CONV (MISCELLANEOUS) ×2 IMPLANT
SPOGE SURGIFLO 8M (HEMOSTASIS) ×1
SPONGE SURGIFLO 8M (HEMOSTASIS) ×1 IMPLANT
SUT DVC VLOC 3-0 CL 6 P-12 (SUTURE) ×2 IMPLANT
SUT VIC AB 0 CT1 27 (SUTURE) ×1
SUT VIC AB 0 CT1 27XCR 8 STRN (SUTURE) ×1 IMPLANT
SUT VIC AB 2-0 CT1 18 (SUTURE) ×2 IMPLANT
SYR 30ML LL (SYRINGE) ×4 IMPLANT
SYR 3ML LL SCALE MARK (SYRINGE) ×2 IMPLANT
TOWEL OR 17X26 4PK STRL BLUE (TOWEL DISPOSABLE) ×6 IMPLANT
TUBING CONNECTING 10 (TUBING) ×2 IMPLANT

## 2019-11-12 NOTE — H&P (Signed)
History of Present Illness: 11/12/2019 Emma Houston presents today for surgical intervention.  She would like to move forward with surgery due to continued symptoms.  09/18/2019  Emma Houston returns to see me. She is now 6 weeks status post right revision knee arthroplasty. She continues to have pain as noted below.  05/15/2019 Emma Houston returns to see me. She continues to have pain down her right leg and around her right knee. She also describes some pain in her anterolateral calf. This prickly bad when she stands and walks. She is also has some discomfort in her right foot. She has thought this through considerably, and is leaning towards a lumbar decompression.  04/10/2019 Emma Houston returns to see me. She continues to have back pain and right leg pain. This is well described below. She is seen Dr. Rosita Kea before who felt that she had some primary knee issues also. She has also seen Dr. Landry Mellow for a left hip injection.  Injections: We received outside records from [place] regarding her injections. She had right L5-S1 epidural steroid injection on August 20, 2018. Per the record, she had substantial relief from this injection.  She had an additional right L5-S1 epidural steroid injection on January 14, 2019.  She had a right L5-S1 epidural steroid injection on May 14, 2018. She had moderate relief of pain with this injection.  She had a right L5-S1 epidural steroid injection on January 26, 2018. She had an additional right L5-S1 epidural steroid injection on Oct 03, 2017 with good relief. She had an additional right L5-S1 epidural steroid injection on August 06, 2017 with good relief.  03/20/2019 Emma Houston is here today with a chief complaint of low back pain, left buttock pain that radiates down her left lateral leg.  She has been having back and leg pain for several years, worsening with time. Her pain gets sharp when she walks and goes down the back and side of her left leg to her  anterolateral calf. It never goes below her mid calf. It is made better by rest and made worse by walking and standing. Bending is also problematic. She additionally has back pain that has been bothering her for many years. She denies weakness or bowel or bladder dysfunction. Her pain worsens over the afternoon.  She has noted a change in her posture for the last several years. She reports not being able to stand as upright as she previously could.  Conservative measures: chiropractor  Physical therapy: has tried in 03/2017 Multimodal medical therapy including regular antiinflammatories: norco, tramadol Injections: has tried epidural steroid injections  05/2018: injection for sciatica in [place] 04/23/17: LEFT L2/L3 TFESI by Dr Verdie Mosher  Past Surgery: denies  Emma Houston has no symptoms of cervical myelopathy.  The symptoms are causing a significant impact on the patient's life.   Review of Systems:  A 10 point review of systems is negative, except for the pertinent positives and negatives detailed in the HPI.  Past Medical History: Past Medical History:  Diagnosis Date  . Anemia Oct 2020  PA Mock - Low  . Chronic kidney disease Frequent Kidney Infections  . Depression  . GERD (gastroesophageal reflux disease)  . Hyperlipidemia  . Hypertension  . Osteoarthritis  . Rheumatoid arthritis (CMS-HCC)  . Stroke (CMS-HCC) 2008  TIA no residual effects   Past Surgical History: Past Surgical History:  Procedure Laterality Date  . FRACTURE SURGERY 2010  . HYSTERECTOMY  . JOINT REPLACEMENT Left 20 yrs ago; 7 years  Knee; Revision  . JOINT REPLACEMENT Right 89yrs ago  Knee  . KNEE ARTHROSCOPY  . REVISION TOTAL KNEE ARTHROPLASTY Right 07/29/2019  Procedure: Right knee poly exchange versus total knee revision; Surgeon: Clovis Pu, MD; Location: [place]; Service: Orthopedics; Laterality: Right;   No Known Allergies  Current Meds  Medication Sig  . aspirin EC 81 MG EC  tablet Take 1 tablet (81 mg total) by mouth daily.  Marland Kitchen buPROPion (WELLBUTRIN XL) 300 MG 24 hr tablet Take 300 mg by mouth daily.  . carvedilol (COREG) 3.125 MG tablet Take 1 tablet (3.125 mg total) by mouth 2 (two) times daily with a meal.  . HYDROcodone-acetaminophen (NORCO) 10-325 MG tablet Take 1 tablet by mouth every 4 (four) hours as needed for pain.  . Multiple Vitamins-Minerals (MULTIVITAMIN WITH MINERALS) tablet Take 1 tablet by mouth daily.  . rosuvastatin (CRESTOR) 10 MG tablet Take 10 mg by mouth daily.  . traZODone (DESYREL) 100 MG tablet Take 200 mg by mouth at bedtime.    Social History: Social History   Tobacco Use  . Smoking status: Former Smoker  Packs/day: 0.00  Years: 0.00  Pack years: 0.00  Types: Cigarettes  Quit date: 1990  Years since quitting: 31.3  . Smokeless tobacco: Never Used  . Tobacco comment: social smoker  Vaping Use  . Vaping Use: Never used  Substance Use Topics  . Alcohol use: Not Currently  . Drug use: Never   Family Medical History: Family History  Problem Relation Age of Onset  . Diabetes type II Mother  . Bone cancer Mother  . Diabetes type II Father  . Stroke Father  . Diabetes type II Son   Physical Examination: Vitals:   Vitals:   11/12/19 0715  BP: (!) 162/72  Pulse: 63  Resp: 16  Temp: (!) 97.1 F (36.2 C)  SpO2: 96%   Heart sounds normal no MRG. Chest Clear to Auscultation Bilaterally.   General: Patient is well developed, well nourished, calm, collected, and in no apparent distress. Attention to examination is appropriate.  Psychiatric: Patient is non-anxious.  Head: Pupils equal, round, and reactive to light.  ENT: Oral mucosa appears well hydrated.  Neck: Supple. Full range of motion.  Respiratory: Patient is breathing without any difficulty.  Extremities: No edema.  Vascular: Palpable dorsal pedal pulses.  Skin: On exposed skin, there are no abnormal skin lesions.  NEUROLOGICAL:   Awake, alert,  oriented to person, place, and time. Speech is clear and fluent. Fund of knowledge is appropriate.   Cranial Nerves: Pupils equal round and reactive to light. Facial tone is symmetric. Facial sensation is symmetric. Shoulder shrug is symmetric. Tongue protrusion is midline. There is no pronator drift.  ROM of spine: full. Palpation of spine: non tender.   She appears to have relatively normal sagittal alignment, though this is difficult to ascertain.  Strength: Side Biceps Triceps Deltoid Interossei Grip Wrist Ext. Wrist Flex.  R 5 5 5 5 5 5 5   L 5 5 5 5 5 5 5    Side Iliopsoas Quads Hamstring PF DF EHL  R 5 5 5 5 5 5   L 5 5 5 5 5 5    Reflexes are 1+ and symmetric at the biceps, triceps, brachioradialis, patella and achilles. Hoffman's is absent. Clonus is not present. Toes are down-going.  Bilateral upper and lower extremity sensation is intact to light touch.  Gait is normal. No difficulty with tandem gait.  No evidence of dysmetria noted.  Medical  Decision Making  Imaging: MRI L spine 10/16/18 L1-2: Posterior disc bulge effacing the left lateral recess. Mild facet hypertrophy and  ligamentum flavum thickening. No definite neural foraminal stenosis.  L2-3: Grade 1 retrolisthesis similar to prior exam. Posterior disc bulge effacing the left  lateral recess. Mild narrowing of the left neural foramen similar to 2017. No right neural  foramen narrowing. Mild facet hypertrophy and ligamentum flavum thickening.  L3-4: Posterior disc bulge effacing the right lateral recess extending to the right neural  foramen with 4 mm of inferior disc migration, minimally progressed compared to 2017. No free  fragment. Extensive right-sided facet hypertrophy and moderate left facet hypertrophy mildly  narrow the central canal. Moderate bilateral neural foramen narrowing greater on the left than  the right, slightly progressed compared to 2017. Degenerative changes.  L4-5: Broad-based posterior  disc bulge effacing the right lateral recess. Mild facet  hypertrophy and ligamentum flavum thickening. Mild right neural foramen narrowing similar to  2017. No left neural foramen narrowing.  L5-S1: Tiny broad-based posterior disc bulge effacing the right lateral recess similar to  2017. Extensive facet and ligamentum flavum hypertrophy without significant central canal  narrowing. Moderate right neural foramen narrowing progressed compared to 2017. Mild left  neural foramen narrowing also progressed compared to 2017  I have personally reviewed the images and agree with the above interpretation.  EMG 04/02/2019 Impression: This is an abnormal electrodiagnostic study consistent with a 1) generalized sensorimotor polyneuropathy. 2) Superimposed chronic left L4 radiculopathy.   Thank you for the referral of this patient. It was our privilege to participate in care of your patient. Feel free to contact us with any further questions.   _____________________________ Cristopher Peru, M.D.  Assessment and Plan: Emma Houston is a pleasant 79 y.o. female with scoliosis with extensive lumbosacral spondylosis. Today she is most bothered by right leg symptoms including pain around her right knee.   We will move forward ith L3-5 decompression. Risks and benefits reviewed in prior note.    Venetia Night MD, [facility] Department of Neurosurgery

## 2019-11-12 NOTE — Discharge Summary (Signed)
Procedure: L3-5 decompression Procedure date: 11/12/2019 Diagnosis: lumbar stenosis with neurogenic claudication   History: Emma Houston is s/p L3-5 decompression POD0: Tolerated procedure well. Evaluated in post op recovery still disoriented from anesthesia but able to answer questions and obey commands.   Physical Exam: Vitals:   11/12/19 0715 11/12/19 1057  BP: (!) 162/72 (!) 144/74  Pulse: 63 64  Resp: 16 17  Temp: (!) 97.1 F (36.2 C) (!) 96.8 F (36 C)  SpO2: 96% 95%    General: Alert and oriented, lying in bed Strength:5/5 throughout lower extremities Sensation: intact and symmetric throughout  Skin: incision with dermabond, clean, dry, intact  Data:  No results for input(s): NA, K, CL, CO2, BUN, CREATININE, LABGLOM, GLUCOSE, CALCIUM in the last 168 hours. No results for input(s): AST, ALT, ALKPHOS in the last 168 hours.  Invalid input(s): TBILI   No results for input(s): WBC, HGB, HCT, PLT in the last 168 hours. No results for input(s): APTT, INR in the last 168 hours.         Assessment/Plan:  Emma Houston is POD0 s/p L3-5 decompression. Once able to urinate, ambulate, and tolerate PO, pt acceptable for discharge.  We will follow up in clinic in 2 weeks.    Patsey Berthold, NP Department of Neurosurgery

## 2019-11-12 NOTE — Anesthesia Procedure Notes (Signed)
Procedure Name: Intubation Date/Time: 11/12/2019 8:40 AM Performed by: Clyde Lundborg, CRNA Pre-anesthesia Checklist: Patient identified, Emergency Drugs available, Suction available and Patient being monitored Patient Re-evaluated:Patient Re-evaluated prior to induction Oxygen Delivery Method: Circle system utilized Preoxygenation: Pre-oxygenation with 100% oxygen Induction Type: IV induction Ventilation: Mask ventilation without difficulty Laryngoscope Size: McGraph Grade View: Grade I Tube type: Oral Tube size: 7.0 mm Number of attempts: 1 Airway Equipment and Method: Video-laryngoscopy Placement Confirmation: ETT inserted through vocal cords under direct vision,  positive ETCO2,  breath sounds checked- equal and bilateral and CO2 detector Secured at: 20 cm Tube secured with: Tape Dental Injury: Teeth and Oropharynx as per pre-operative assessment

## 2019-11-12 NOTE — Discharge Instructions (Addendum)
Please be cautious taking Robaxin (muscle relaxant) and Trazodone and oxycodone together. They may make you sleepy. Separate these medications, do not take at the same time.   Your surgeon has performed an operation on your lumbar spine (low back) to relieve pressure on one or more nerves. Many times, patients feel better immediately after surgery and can overdo it. Even if you feel well, it is important that you follow these activity guidelines. If you do not let your back heal properly from the surgery, you can increase the chance of a disc herniation and/or return of your symptoms. The following are instructions to help in your recovery once you have been discharged from the hospital.  * Do not take anti-inflammatory medications for 3 days after surgery (naproxen [Aleve], ibuprofen [Advil, Motrin], celecoxib [Celebrex], etc.)  Activity    No bending, lifting, or twisting (BLT). Avoid lifting objects heavier than 10 pounds (gallon milk jug).  Where possible, avoid household activities that involve lifting, bending, pushing, or pulling such as laundry, vacuuming, grocery shopping, and childcare. Try to arrange for help from friends and family for these activities while your back heals.  Increase physical activity slowly as tolerated.  Taking short walks is encouraged, but avoid strenuous exercise. Do not jog, run, bicycle, lift weights, or participate in any other exercises unless specifically allowed by your doctor. Avoid prolonged sitting, including car rides.  Talk to your doctor before resuming sexual activity.  You should not drive until cleared by your doctor.  Until released by your doctor, you should not return to work or school.  You should rest at home and let your body heal.   You may shower two days after your surgery.  After showering, lightly dab your incision dry. Do not take a tub bath or go swimming for 3 weeks, or until approved by your doctor at your follow-up  appointment.  If you smoke, we strongly recommend that you quit.  Smoking has been proven to interfere with normal healing in your back and will dramatically reduce the success rate of your surgery. Please contact [facility] (800-QUIT-NOW) and use the resources at www.[facility].com for assistance in stopping smoking.  Surgical Incision     If you have staples or stitches on your incision, you should have a follow up scheduled for removal. If you do not have staples or stitches, you will have steri-strips (small pieces of surgical tape) or Dermabond glue. The steri-strips/glue should begin to peel away within about a week (it is fine if the steri-strips fall off before then). If the strips are still in place one week after your surgery, you may gently remove them.  Diet            You may return to your usual diet. Be sure to stay hydrated.  When to Contact us  Although your surgery and recovery will likely be uneventful, you may have some residual numbness, aches, and pains in your back and/or legs. This is normal and should improve in the next few weeks.  However, should you experience any of the following, contact us immediately:  New numbness or weakness  Pain that is progressively getting worse, and is not relieved by your pain medications or rest  Bleeding, redness, swelling, pain, or drainage from surgical incision  Chills or flu-like symptoms  Fever greater than 101.0 F (38.3 C)  Problems with bowel or bladder functions  Difficulty breathing or shortness of breath  Warmth, tenderness, or swelling in your calf  Contact  Information  During office hours (Monday-Friday 9 am to 5 pm), please call your physician at [phone]  After hours and weekends, please call [phone] and an answering service will put you in touch with either Dr. Lacinda Axon or Dr. Izora Ribas.   For a life-threatening emergency, call 911 Bupivacaine Liposomal Suspension for Injection What is this  medicine? BUPIVACAINE LIPOSOMAL (bue PIV a kane LIP oh som al) is an anesthetic. It causes loss of feeling in the skin or other tissues. It is used to prevent and to treat pain from some procedures. This medicine may be used for other purposes; ask your health care provider or pharmacist if you have questions. COMMON BRAND NAME(S): EXPAREL What should I tell my health care provider before I take this medicine? They need to know if you have any of these conditions:  G6PD deficiency  heart disease  kidney disease  liver disease  low blood pressure  lung or breathing disease, like asthma  an unusual or allergic reaction to bupivacaine, other medicines, foods, dyes, or preservatives  pregnant or trying to get pregnant  breast-feeding How should I use this medicine? This medicine is for injection into the affected area. It is given by a health care professional in a hospital or clinic setting. Talk to your pediatrician regarding the use of this medicine in children. Special care may be needed. Overdosage: If you think you have taken too much of this medicine contact a poison control center or emergency room at once. NOTE: This medicine is only for you. Do not share this medicine with others. What if I miss a dose? This does not apply. What may interact with this medicine? This medicine may interact with the following medications:  acetaminophen  certain antibiotics like dapsone, nitrofurantoin, aminosalicylic acid, sulfonamides  certain medicines for seizures like phenobarbital, phenytoin, valproic acid  chloroquine  cyclophosphamide  flutamide  hydroxyurea  ifosfamide  metoclopramide  nitric oxide  nitroglycerin  nitroprusside  nitrous oxide  other local anesthetics like lidocaine, pramoxine, tetracaine  primaquine  quinine  rasburicase  sulfasalazine This list may not describe all possible interactions. Give your health care provider a list of all the  medicines, herbs, non-prescription drugs, or dietary supplements you use. Also tell them if you smoke, drink alcohol, or use illegal drugs. Some items may interact with your medicine. What should I watch for while using this medicine? Your condition will be monitored carefully while you are receiving this medicine. Be careful to avoid injury while the area is numb, and you are not aware of pain. What side effects may I notice from receiving this medicine? Side effects that you should report to your doctor or health care professional as soon as possible:  allergic reactions like skin rash, itching or hives, swelling of the face, lips, or tongue  seizures  signs and symptoms of a dangerous change in heartbeat or heart rhythm like chest pain; dizziness; fast, irregular heartbeat; palpitations; feeling faint or lightheaded; falls; breathing problems  signs and symptoms of methemoglobinemia such as pale, gray, or blue colored skin; headache; fast heartbeat; shortness of breath; feeling faint or lightheaded, falls; tiredness Side effects that usually do not require medical attention (report to your doctor or health care professional if they continue or are bothersome):  anxious  back pain  changes in taste  changes in vision  constipation  dizziness  fever  nausea, vomiting This list may not describe all possible side effects. Call your doctor for medical advice about side effects.  You may report side effects to FDA at 1-800-FDA-1088. Where should I keep my medicine? This drug is given in a hospital or clinic and will not be stored at home. NOTE: This sheet is a summary. It may not cover all possible information. If you have questions about this medicine, talk to your doctor, pharmacist, or health care provider.  2020 Elsevier/Gold Standard (2019-02-25 10:48:23)   AMBULATORY SURGERY  DISCHARGE INSTRUCTIONS   1) The drugs that you were given will stay in your system until tomorrow  so for the next 24 hours you should not:  A) Drive an automobile B) Make any legal decisions C) Drink any alcoholic beverage   2) You may resume regular meals tomorrow.  Today it is better to start with liquids and gradually work up to solid foods.  You may eat anything you prefer, but it is better to start with liquids, then soup and crackers, and gradually work up to solid foods.   3) Please notify your doctor immediately if you have any unusual bleeding, trouble breathing, redness and pain at the surgery site, drainage, fever, or pain not relieved by medication.    4) Additional Instructions:        Please contact your physician with any problems or Same Day Surgery at [phone], Monday through Friday 6 am to 4 pm, or [place] at [facility] number at [phone].

## 2019-11-12 NOTE — Anesthesia Postprocedure Evaluation (Signed)
Anesthesia Post Note  Patient: Emma Houston  Procedure(s) Performed: L3-5 DECOMPRESSION (N/A )  Patient location during evaluation: PACU Anesthesia Type: General Level of consciousness: awake and alert Pain management: pain level controlled Vital Signs Assessment: post-procedure vital signs reviewed and stable Respiratory status: spontaneous breathing, nonlabored ventilation, respiratory function stable and patient connected to nasal cannula oxygen Cardiovascular status: blood pressure returned to baseline and stable Postop Assessment: no apparent nausea or vomiting Anesthetic complications: no   No complications documented.   Last Vitals:  Vitals:   11/12/19 1201 11/12/19 1247  BP: (!) 176/85 (!) 153/61  Pulse: (!) 53 (!) 52  Resp: 16 16  Temp: (!) 35.9 C (!) 36.1 C  SpO2: 100% 100%    Last Pain:  Vitals:   11/12/19 1247  TempSrc: Temporal  PainSc: 3                  Corinda Gubler

## 2019-11-12 NOTE — Anesthesia Preprocedure Evaluation (Addendum)
Anesthesia Evaluation  Patient identified by MRN, date of birth, ID band Patient awake    Reviewed: Allergy & Precautions, NPO status , Patient's Chart, lab work & pertinent test results  History of Anesthesia Complications Negative for: history of anesthetic complications  Airway Mallampati: II  TM Distance: >3 FB Neck ROM: Full    Dental no notable dental hx. (+) Teeth Intact, Dental Advisory Given   Pulmonary neg pulmonary ROS, neg sleep apnea, neg COPD, Patient abstained from smoking.Not current smoker, former smoker,    Pulmonary exam normal breath sounds clear to auscultation       Cardiovascular Exercise Tolerance: Good METShypertension, + CAD  (-) Past MI (-) dysrhythmias  Rhythm:Regular Rate:Normal - Systolic murmurs TTE 0086: 1. Left ventricular ejection fraction, by visual estimation, is 70 to  75%. The left ventricle has hyperdynamic function. Normal left ventricular  size. There is mildly increased left ventricular hypertrophy.  2. Left ventricular diastolic Doppler parameters are consistent with  impaired relaxation pattern of LV diastolic filling.  3. Global right ventricle has normal systolic function.The right  ventricular size is mildly enlarged. No increase in right ventricular wall  thickness.  4. Left atrial size was normal.  5. Right atrial size was mildly dilated.  6. Trivial pericardial effusion is present.  7. Mild aortic valve annular calcification.  8. Moderate mitral annular calcification.  9. The mitral valve is abnormal. Trace mitral valve regurgitation.  10. The tricuspid valve is normal in structure. Tricuspid valve  regurgitation is trivial.  11. The aortic valve is tricuspid Aortic valve regurgitation was not  visualized by color flow Doppler. Mild to moderate aortic valve  sclerosis/calcification without any evidence of aortic stenosis.  12. The pulmonic valve was not well  visualized. Pulmonic valve  regurgitation is not visualized by color flow Doppler.  13. TR signal is inadequate for assessing pulmonary artery systolic  pressure.  14. The inferior vena cava is normal in size with greater than 50%  respiratory variability, suggesting right atrial pressure of 3 mmHg.  15. Interatrial shunt cannot be excluded.  16. There is redundancy of the interatrial septum.  17. There is right bowing of the interatrial septum, suggestive of  elevated left atrial pressure.    Neuro/Psych PSYCHIATRIC DISORDERS Depression TIA   GI/Hepatic GERD  Controlled,(+)     (-) substance abuse  ,   Endo/Other  neg diabetes  Renal/GU CRFRenal disease     Musculoskeletal  (+) Arthritis ,   Abdominal   Peds  Hematology  (+) anemia ,   Anesthesia Other Findings Past Medical History: No date: Anemia No date: Arthritis     Comment:  RA No date: Chronic back pain No date: Chronic kidney disease     Comment:  frequent kidney infections No date: Coronary artery disease No date: Depression No date: GERD (gastroesophageal reflux disease) No date: Hyperlipidemia No date: Hypertension No date: Insomnia No date: Stroke [facility])     Comment:  TIA  Reproductive/Obstetrics                            Anesthesia Physical Anesthesia Plan  ASA: II  Anesthesia Plan: General   Post-op Pain Management:    Induction: Intravenous  PONV Risk Score and Plan: 3 and Ondansetron, Dexamethasone and Treatment may vary due to age or medical condition  Airway Management Planned: Oral ETT  Additional Equipment: None  Intra-op Plan:   Post-operative Plan: Extubation in OR  Informed Consent: I have reviewed the patients History and Physical, chart, labs and discussed the procedure including the risks, benefits and alternatives for the proposed anesthesia with the patient or authorized representative who has indicated his/her understanding and acceptance.      Dental advisory given  Plan Discussed with: CRNA and Surgeon  Anesthesia Plan Comments: (Discussed risks of anesthesia with patient, including PONV, sore throat, lip/dental damage. Rare risks discussed as well, such as cardiorespiratory and neurological sequelae. Patient understands.)        Anesthesia Quick Evaluation

## 2019-11-12 NOTE — Transfer of Care (Signed)
Immediate Anesthesia Transfer of Care Note  Patient: Emma Houston  Procedure(s) Performed: L3-5 DECOMPRESSION (N/A )  Patient Location: PACU  Anesthesia Type:General  Level of Consciousness: awake, alert  and oriented  Airway & Oxygen Therapy: Patient Spontanous Breathing  Post-op Assessment: Report given to RN and Post -op Vital signs reviewed and stable  Post vital signs: Reviewed and stable  Last Vitals:  Vitals Value Taken Time  BP    Temp    Pulse    Resp    SpO2      Last Pain:  Vitals:   11/12/19 0715  TempSrc: Temporal  PainSc: 0-No pain         Complications: No complications documented.

## 2019-11-12 NOTE — Op Note (Signed)
Indications: Ms. Plotz is a 79 yo female who presented with neurogenic claudication due to lumbar stenosis.  She failed conservative management and elected for surgical intervention.  Findings: lumbar stenosis  Preoperative Diagnosis: Lumbar Stenosis with neurogenic claudication Postoperative Diagnosis: same   EBL: 20 ml IVF: 600 ml Drains: none Disposition: Extubated and Stable to PACU Complications: none  No foley catheter was placed.   Preoperative Note:   Risks of surgery discussed include: infection, bleeding, stroke, coma, death, paralysis, CSF leak, nerve/spinal cord injury, numbness, tingling, weakness, complex regional pain syndrome, recurrent stenosis and/or disc herniation, vascular injury, development of instability, neck/back pain, need for further surgery, persistent symptoms, development of deformity, and the risks of anesthesia. The patient understood these risks and agreed to proceed.  Operative Note:   1. L3-4 and L4-5 lumbar decompression including central laminectomy and bilateral medial facetectomies including foraminotomies  The patient was then brought from the preoperative center with intravenous access established.  The patient underwent general anesthesia and endotracheal tube intubation, and was then rotated on the [place] rail top where all pressure points were appropriately padded.  The skin was then thoroughly cleansed.  Perioperative antibiotic prophylaxis was administered.  Sterile prep and drapes were then applied and a timeout was then observed.  C-arm was brought into the field under sterile conditions and under lateral visualization the L4-5 interspace was identified and marked.  The incision was marked on the right and injected with local anesthetic. Once this was complete a 4 cm incision was opened with the use of a #10 blade knife.    The metrx tubes were sequentially advanced and confirmed in position at L4-5. An 38mm by 27mm tube was locked in  place to the bed side attachment.  The microscope was then sterilely brought into the field and muscle creep was hemostased with a bipolar and resected with a pituitary rongeur.  A Bovie extender was then used to expose the spinous process and lamina.  Careful attention was placed to not violate the facet capsule. A 3 mm matchstick drill bit was then used to make a hemi-laminotomy trough until the ligamentum flavum was exposed.  This was extended to the base of the spinous process and to the contralateral side to remove all the central bone from each side.  Once this was complete and the underlying ligamentum flavum was visualized, it was dissected with a curette and resected with Kerrison rongeurs.  Extensive ligamentum hypertrophy was noted, requiring a substantial amount of time and care for removal.  The dura was identified and palpated. The kerrison rongeur was then used to remove the medial facet bilaterally until no compression was noted.  A balltip probe was used to confirm decompression of the ipsilateral L5 nerve root.  Additional attention was paid to completion of the contralateral L4-5 foraminotomy until the contralateral traversing nerve root was completely free.  Once this was complete, L4-5 central decompression including medial facetectomy and foraminotomy was confirmed and decompression on both sides was confirmed. No CSF leak was noted.  A Depo-Medrol soaked Gelfoam pledget was placed in the defect.  The wound was copiously irrigated. The tube system was then removed under microscopic visualization and hemostasis was obtained with a bipolar.    After performing the decompression at L4-5, the metrx tubes were sequentially advanced and confirmed in position at L3-4. An 37mm by 16mm tube was locked in place to the bed side attachment.  Fluoroscopy was then removed from the field.  The microscope was then  sterilely brought into the field and muscle creep was hemostased with a bipolar and  resected with a pituitary rongeur.  A Bovie extender was then used to expose the spinous process and lamina.  Careful attention was placed to not violate the facet capsule. A 3 mm matchstick drill bit was then used to make a hemi-laminotomy trough until the ligamentum flavum was exposed.  This was extended to the base of the spinous process and to the contralateral side to remove all the central bone from each side.  Once this was complete and the underlying ligamentum flavum was visualized, it was dissected with a curette and resected with Kerrison rongeurs.  Extensive ligamentum hypertrophy was noted, requiring a substantial amount of time and care for removal.  The dura was identified and palpated. The kerrison rongeur was then used to remove the medial facet bilaterally until no compression was noted.  A balltip probe was used to confirm decompression of the ipsilateral L4 nerve root.  Additional attention was paid to completion of the contralateral foraminotomy until the contralateral L4 nerve root was completely free.  Once this was complete, L3-4 central decompression including medial facetectomy and foraminotomy was confirmed and decompression on both sides was confirmed. No CSF leak was noted.  A Depo-Medrol soaked Gelfoam pledget was placed in the defect.  The wound was copiously irrigated. The tube system was then removed under microscopic visualization and hemostasis was obtained with a bipolar.     The fascial layer was reapproximated with the use of a 0 Vicryl suture.  Subcutaneous tissue layer was reapproximated using 2-0 Vicryl suture.  3-0 monocryl was placed in subcuticular fashion. The skin was then cleansed and Dermabond was used to close the skin opening.  Patient was then rotated back to the preoperative bed awakened from anesthesia and taken to recovery all counts are correct in this case.  I performed the entire procedure with the assistance of Lonell Face NP as an Civil engineer, contracting.  Greenley Martone K. Izora Ribas MD

## 2019-11-13 ENCOUNTER — Encounter: Payer: Self-pay | Admitting: Neurosurgery

## 2021-03-30 ENCOUNTER — Ambulatory Visit: Payer: Medicare Other | Admitting: Podiatry

## 2021-04-04 ENCOUNTER — Encounter: Payer: Self-pay | Admitting: Podiatry

## 2021-04-04 ENCOUNTER — Other Ambulatory Visit: Payer: Self-pay | Admitting: Podiatry

## 2021-04-04 ENCOUNTER — Ambulatory Visit (INDEPENDENT_AMBULATORY_CARE_PROVIDER_SITE_OTHER): Payer: Medicare Other

## 2021-04-04 ENCOUNTER — Other Ambulatory Visit: Payer: Self-pay

## 2021-04-04 ENCOUNTER — Ambulatory Visit (INDEPENDENT_AMBULATORY_CARE_PROVIDER_SITE_OTHER): Payer: Medicare Other | Admitting: Podiatry

## 2021-04-04 DIAGNOSIS — M7751 Other enthesopathy of right foot: Secondary | ICD-10-CM

## 2021-04-04 DIAGNOSIS — M778 Other enthesopathies, not elsewhere classified: Secondary | ICD-10-CM

## 2021-04-04 DIAGNOSIS — M2041 Other hammer toe(s) (acquired), right foot: Secondary | ICD-10-CM | POA: Diagnosis not present

## 2021-04-04 DIAGNOSIS — M2042 Other hammer toe(s) (acquired), left foot: Secondary | ICD-10-CM | POA: Diagnosis not present

## 2021-04-04 MED ORDER — TRIAMCINOLONE ACETONIDE 40 MG/ML IJ SUSP
40.0000 mg | Freq: Once | INTRAMUSCULAR | Status: AC
  Administered 2021-04-04: 40 mg

## 2021-04-04 NOTE — Progress Notes (Signed)
Subjective:  Patient ID: Emma Houston, female    DOB: 1941/04/20,  MRN: [id number] HPI Chief Complaint  Patient presents with   Foot Pain    Dorsal midfoot bilateral - aching x several months, doc tried to "adjust" the foot, previous surgeries, using NSAID cream   New Patient (Initial Visit)    80 y.o. female presents with the above complaint.  ROS: Denies fever chills nausea vomiting muscle aches pains calf pain back pain chest pain shortness of breath.  Past Medical History:  Diagnosis Date   Anemia    Arthritis    RA   Chronic back pain    Chronic kidney disease    frequent kidney infections   Coronary artery disease    Depression    GERD (gastroesophageal reflux disease)    Hyperlipidemia    Hypertension    Insomnia    Stroke (HCC)    TIA   Past Surgical History:  Procedure Laterality Date   ABDOMINAL HYSTERECTOMY     FRACTURE SURGERY Bilateral    plate on left   JOINT REPLACEMENT Right    knee   JOINT REPLACEMENT Left    KNEE ARTHROSCOPY Left    LUMBAR LAMINECTOMY/DECOMPRESSION MICRODISCECTOMY N/A 11/12/2019   Procedure: L3-5 DECOMPRESSION;  Surgeon: Venetia Night, MD;  Location: [facility] ORS;  Service: Neurosurgery;  Laterality: N/A;    Current Outpatient Medications:    buPROPion (WELLBUTRIN XL) 150 MG 24 hr tablet, Take 150 mg by mouth every morning., Disp: , Rfl:    carvedilol (COREG) 3.125 MG tablet, Take 1 tablet (3.125 mg total) by mouth 2 (two) times daily with a meal., Disp: 60 tablet, Rfl: 0   mirtazapine (REMERON) 7.5 MG tablet, Take 7.5 mg by mouth at bedtime., Disp: , Rfl:    Multiple Vitamins-Minerals (MULTIVITAMIN WITH MINERALS) tablet, Take 1 tablet by mouth daily., Disp: , Rfl:    nitroGLYCERIN (NITROSTAT) 0.4 MG SL tablet, Place 1 tablet (0.4 mg total) under the tongue every 5 (five) minutes as needed for chest pain. (Patient not taking: Reported on 11/12/2019), Disp: 20 tablet, Rfl: 0   rosuvastatin (CRESTOR) 10 MG tablet, Take 10 mg by  mouth daily., Disp: , Rfl:    triamcinolone ointment (KENALOG) 0.1 %, Apply topically., Disp: , Rfl:   No Known Allergies Review of Systems Objective:  There were no vitals filed for this visit.  General: Well developed, nourished, in no acute distress, alert and oriented x3   Dermatological: Skin is warm, dry and supple bilateral. Nails x 10 are well maintained; remaining integument appears unremarkable at this time. There are no open sores, no preulcerative lesions, no rash or signs of infection present.  Vascular: Dorsalis Pedis artery and Posterior Tibial artery pedal pulses are 2/4 bilateral with immedate capillary fill time. Pedal hair growth present. No varicosities and no lower extremity edema present bilateral.   Neruologic: Grossly intact via light touch bilateral. Vibratory intact via tuning fork bilateral. Protective threshold with Semmes Wienstein monofilament intact to all pedal sites bilateral. Patellar and Achilles deep tendon reflexes 2+ bilateral. No Babinski or clonus noted bilateral.   Musculoskeletal: No gross boney pedal deformities bilateral. No pain, crepitus, or limitation noted with foot and ankle range of motion bilateral. Muscular strength 5/5 in all groups tested bilateral.  Severe pain on palpation dorsal aspect of the bilateral foot overlying Lisfranc's joints exquisitely tender on palpation no fluctuance noted warmth on palpation.  Also has hammertoe deformities with lateral deviation of the second digit at the level  of the PIPJ and the metatarsal phalangeal joint right.  Gait: Unassisted, Nonantalgic.    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  Joint space narrowing subchondral sclerosis dorsal spurring is noted otherwise osteopenia is noted and no acute findings such as fractures or infection.  Assessment & Plan:   Assessment: Hammertoe deformities and plantarflexed second  metatarsal right foot.  Osteoarthritis dorsal aspect bilateral foot capsulitis bilateral foot.  Plan: Discussed etiology pathology conservative surgical therapies discussed possible need for hammertoe repair and second metatarsal osteotomy.  She understands and is amenable to it.  Also discussed injecting the dorsal aspect of the bilateral foot today and the use of Voltaren gel.  I injected the bilateral foot 10 mg Kenalog 5 mg Marcaine point of maximal tenderness and did discuss with her possible tendon issues associated with this.  She understands this amenable to it.     Samrat Hayward T. [place], [place]

## 2021-04-19 ENCOUNTER — Other Ambulatory Visit (HOSPITAL_COMMUNITY): Payer: Self-pay | Admitting: Physical Medicine & Rehabilitation

## 2021-04-19 ENCOUNTER — Other Ambulatory Visit: Payer: Self-pay | Admitting: Physical Medicine & Rehabilitation

## 2021-04-19 DIAGNOSIS — G8929 Other chronic pain: Secondary | ICD-10-CM

## 2021-05-03 ENCOUNTER — Ambulatory Visit: Admission: RE | Admit: 2021-05-03 | Payer: Medicare Other | Source: Ambulatory Visit

## 2021-05-04 ENCOUNTER — Inpatient Hospital Stay: Payer: Medicare Other

## 2021-06-01 ENCOUNTER — Ambulatory Visit
Admission: RE | Admit: 2021-06-01 | Discharge: 2021-06-01 | Disposition: A | Discharge status: Home / Self Care | Payer: Medicare Other | Source: Ambulatory Visit | Attending: Physical Medicine & Rehabilitation | Admitting: Physical Medicine & Rehabilitation

## 2021-06-01 ENCOUNTER — Other Ambulatory Visit: Payer: Self-pay

## 2021-06-01 DIAGNOSIS — M5442 Lumbago with sciatica, left side: Secondary | ICD-10-CM | POA: Insufficient documentation

## 2021-06-01 DIAGNOSIS — G8929 Other chronic pain: Secondary | ICD-10-CM | POA: Diagnosis present

## 2021-07-25 ENCOUNTER — Encounter: Payer: Self-pay | Admitting: Podiatry

## 2021-07-25 ENCOUNTER — Other Ambulatory Visit: Payer: Self-pay

## 2021-07-25 ENCOUNTER — Ambulatory Visit: Payer: Medicare Other | Admitting: Podiatry

## 2021-07-25 DIAGNOSIS — M778 Other enthesopathies, not elsewhere classified: Secondary | ICD-10-CM

## 2021-07-25 MED ORDER — TRIAMCINOLONE ACETONIDE 40 MG/ML IJ SUSP
40.0000 mg | Freq: Once | INTRAMUSCULAR | Status: AC
  Administered 2021-07-25: 40 mg

## 2021-07-25 NOTE — Progress Notes (Signed)
She presents today for follow-up of capsulitis dorsal aspect of the bilateral foot and she is on her way to [place] to spend time with her family.  She states that the left foot is hurting on the top and I like to consider an injection on both if possible.  Objective: Vital signs are stable she is alert and oriented x3 she still has pain on palpation dorsal aspect of the tarsometatarsal joints with dorsal spurring.  Radiating pain on palpation.  Assessment: Pain in limb secondary to osteoarthritis of the midfoot bilateral left greater than right.  Plan: Injected 10 mg of Kenalog 5 mg Marcaine to the dorsal aspect of the bilateral foot today.  Tolerated procedure well without complications I will follow-up with her in 4 months.

## 2021-12-21 ENCOUNTER — Ambulatory Visit: Payer: Medicare Other | Admitting: Podiatry

## 2021-12-21 ENCOUNTER — Encounter: Payer: Self-pay | Admitting: Podiatry

## 2021-12-21 DIAGNOSIS — M778 Other enthesopathies, not elsewhere classified: Secondary | ICD-10-CM | POA: Diagnosis not present

## 2021-12-21 MED ORDER — TRIAMCINOLONE ACETONIDE 40 MG/ML IJ SUSP: 40.0000 mg | Freq: Once | INTRAMUSCULAR | Status: DC

## 2021-12-21 NOTE — Progress Notes (Signed)
She presents today for follow-up of capsulitis dorsal aspect of the bilateral foot and the osteoarthritis as well as the neuritis is associated with it.  She has flexible hammertoe deformities to toes #2 #3 #4 of the right foot.  She states that they crossed over sometimes and she would like to have them straightened possible.  Objective: Vital signs are stable she is alert and oriented x3 there is no erythema edema cellulitis drainage or odor osteoarthritis and neuritis with capsulitis dorsal aspect of bilateral foot painful on palpation and attempted range of motion.  She also has flexible hammertoe deformities to toes #2 #3 #4 of the right foot.  Fifth digit is rigid.  Assessment: Hammertoe deformities 2 through 4 right.  Also osteoarthritis dorsal aspect bilateral foot.  Plan: I injected the bilateral dorsal aspect of the foot with Kenalog and local anesthetic I will follow-up with her in 3 months at the end of the day for a flexor tenotomy to the aforementioned toes.

## 2022-03-29 ENCOUNTER — Telehealth: Payer: Self-pay | Admitting: Podiatry

## 2022-03-29 ENCOUNTER — Ambulatory Visit: Payer: Medicare Other | Admitting: Podiatry

## 2022-03-29 NOTE — Telephone Encounter (Signed)
Pt left voicemail to cxl appt for today she is not feeling well but did not leave a DOB and it was hard to understand the name on the message.   I returned call and spoke to daughter and they cxled the appt and they will call to r/s

## 2022-07-04 DIAGNOSIS — R21 Rash and other nonspecific skin eruption: Secondary | ICD-10-CM | POA: Insufficient documentation

## 2022-09-18 ENCOUNTER — Ambulatory Visit: Payer: Medicare Other | Admitting: Podiatry

## 2022-09-27 ENCOUNTER — Ambulatory Visit: Payer: Medicare Other | Admitting: Podiatry

## 2022-09-27 ENCOUNTER — Encounter: Payer: Self-pay | Admitting: Podiatry

## 2022-09-27 DIAGNOSIS — M778 Other enthesopathies, not elsewhere classified: Secondary | ICD-10-CM | POA: Diagnosis not present

## 2022-09-27 MED ORDER — TRIAMCINOLONE ACETONIDE 40 MG/ML IJ SUSP
40.0000 mg | Freq: Once | INTRAMUSCULAR | Status: AC
Start: 2022-09-27 — End: 2022-09-27
  Administered 2022-09-27: 40 mg

## 2022-09-27 NOTE — Progress Notes (Signed)
She presents today for follow-up of her painful capsulitis dorsal aspect bilateral.  States that think any injections they really helped last time but it was unable to make my last appointment.  I would also like to consider some type of simple procedure for these toes as she points to the second third and fourth digits of the right foot.  She states that they are becoming more painful and overlapping as time goes on.  Objective: Vital signs are stable alert and oriented x 3.  Pulses are palpable.  She has flexible digital deformities 2 through 4 of the right foot.  There plantarflexed and contracted at the PIPJ's and DIPJ's.  She also has some dorsal contraction at the metatarsophalangeal joints.  Palpable osteoarthritis dorsal aspect of the bilateral foot.  She has pain on palpation to this area.  Assessment: Hammertoe deformities 2 through 4 of the right foot.  Osteoarthritis and capsulitis with neuritis dorsal aspect bilateral foot.  Plan: Discussed etiology pathology conservative surgical therapies discussed flexor tenotomy's extensor tenotomy's to be performed in the office and on an upcoming afternoon.  I also discussed with her injections to the dorsal aspect of the bilateral foot which we did today with 10 mg Kenalog 5 mg Marcaine.  Follow-up with her in the near future.

## 2022-11-01 ENCOUNTER — Ambulatory Visit: Payer: Medicare Other | Admitting: Podiatry

## 2022-11-01 ENCOUNTER — Encounter: Payer: Self-pay | Admitting: Podiatry

## 2022-11-01 DIAGNOSIS — M24574 Contracture, right foot: Secondary | ICD-10-CM | POA: Diagnosis not present

## 2022-11-01 DIAGNOSIS — M24576 Contracture, unspecified foot: Secondary | ICD-10-CM

## 2022-11-01 NOTE — Patient Instructions (Signed)
Leave bandage in place and dry for 4 days, then remove. You may wash foot normally after removal of bandage. DO NOT SOAK FOOT! Dry completely afterwards and may use a bandaid over incision if needed. We will follow up with you in 1 weeks for recheck.  

## 2022-11-01 NOTE — Progress Notes (Signed)
She presents today for follow-up discussion of her tenotomy's #2 #3 #4 of her right foot.  She like to go ahead with this procedure the last time she was in we discussed this in great detail with her daughter they understand the possible complications that are associated with this so we went ahead and had her sign a consent today for flexor tenotomy's of toes #2 #3 #4.  Objective: Flexor tenotomy's were performed today after local anesthetic was administered flexible hammertoe deformities were straightened.  Assessment: Hammertoe deformities #2 #3 #4.  Plan: Flexor tenotomy's were performed today after local anesthetic was administered and consent was obtained.  She tolerated procedure well was placed in a dry sterile compressive dressing and in a Darco shoe.  I like to follow-up with her in 1 week for evaluation.

## 2022-11-08 ENCOUNTER — Encounter: Payer: Self-pay | Admitting: Podiatry

## 2022-11-08 ENCOUNTER — Ambulatory Visit (INDEPENDENT_AMBULATORY_CARE_PROVIDER_SITE_OTHER): Payer: Medicare Other | Admitting: Podiatry

## 2022-11-08 DIAGNOSIS — Z9889 Other specified postprocedural states: Secondary | ICD-10-CM

## 2022-11-08 DIAGNOSIS — M24576 Contracture, unspecified foot: Secondary | ICD-10-CM

## 2022-11-08 NOTE — Progress Notes (Signed)
She presents today with her daughter for follow-up of her flexor tenotomy's toes #2 #3 #4 on the right foot.  States that it really does not hurt but I am not sure that it worked.  Objective: Vital signs are stable alert and oriented x 3.  There is no erythema edema cellulitis drainage or odor toes are sitting much more rectus though the second digit still has a lateral deviation but that is due to previous surgical intervention.  Assessment well-healing surgical toes rectus position for the most part.  Plan: Follow-up with her on an as-needed basis.

## 2023-02-10 IMAGING — MR MR LUMBAR SPINE W/O CM
4 of 5 series · 24 of 48 positions shown · non-contrast
Comparison: None.

CLINICAL DATA: Lower back pain, fell 2-3 months ago. Pain is worse
with activity now interfering with sleep. Surgery approximately a
year ago.

EXAM:
MRI LUMBAR SPINE WITHOUT CONTRAST
TECHNIQUE: Multiplanar, multisequence MR imaging of the lumbar spine was
performed. No intravenous contrast was administered.

[Series 2: T2 · sagittal · 4.0mm · 0.81mm/px · 6 of 19 slices shown (1 of 2)]
[im 1/19]
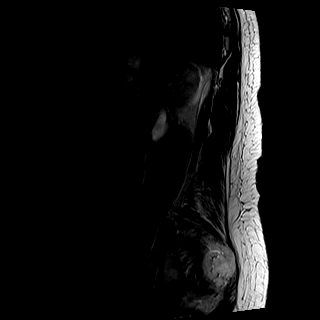
[im 4/19]
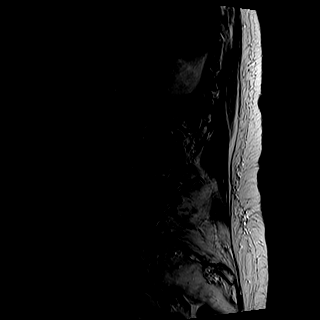
[im 8/19]
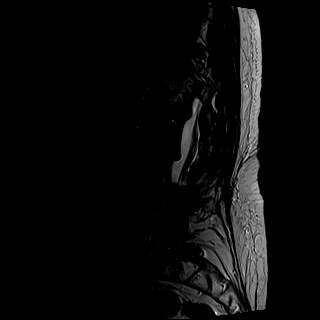
[im 11/19]
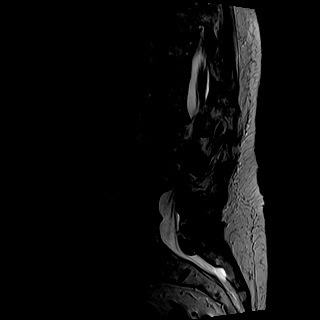
[im 15/19]
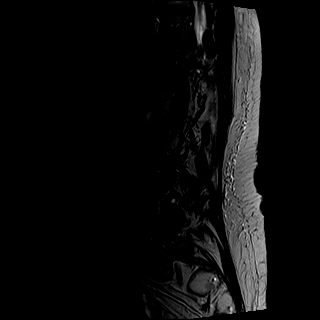
[im 19/19]
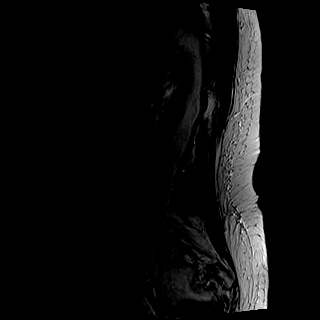

[Series 3: T1 · sagittal · 4.0mm · 0.41mm/px · 7 of 19 slices shown (1 of 2)]
[im 1/19]
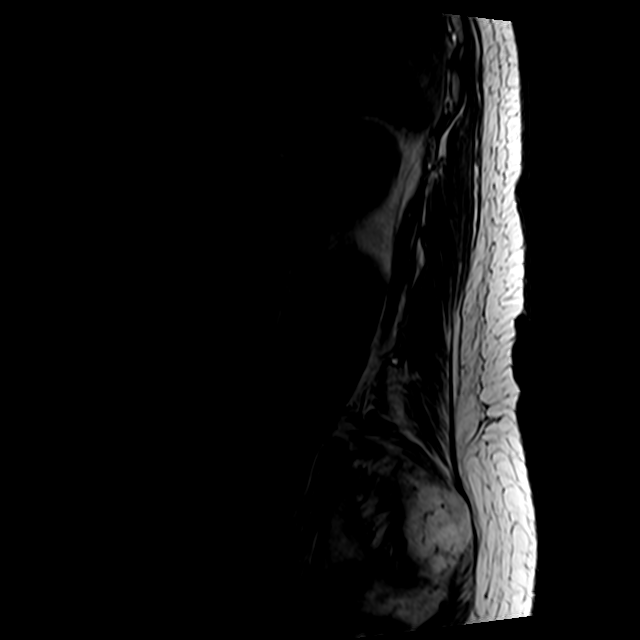
[im 4/19]
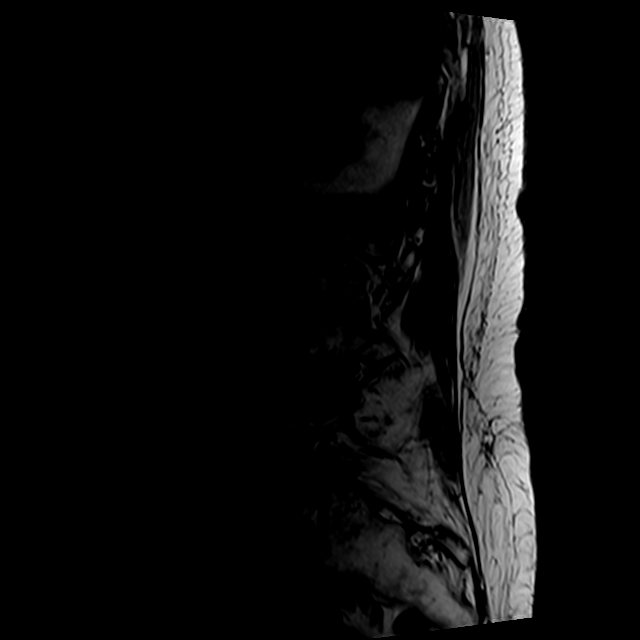
[im 7/19]
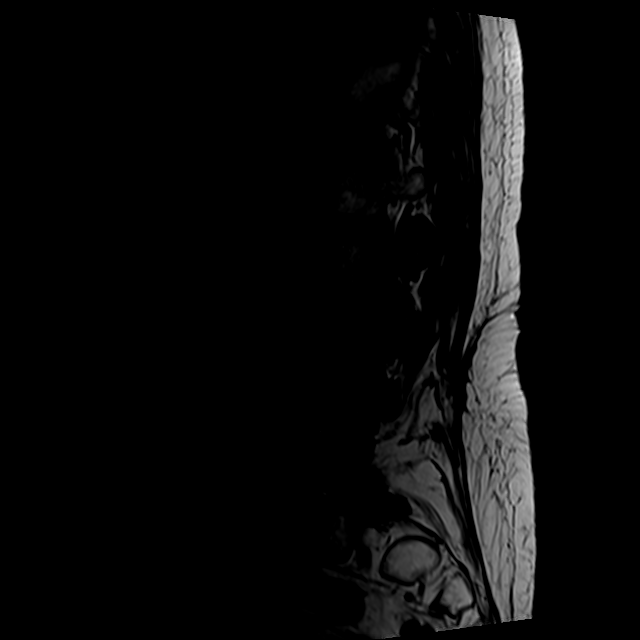
[im 10/19]
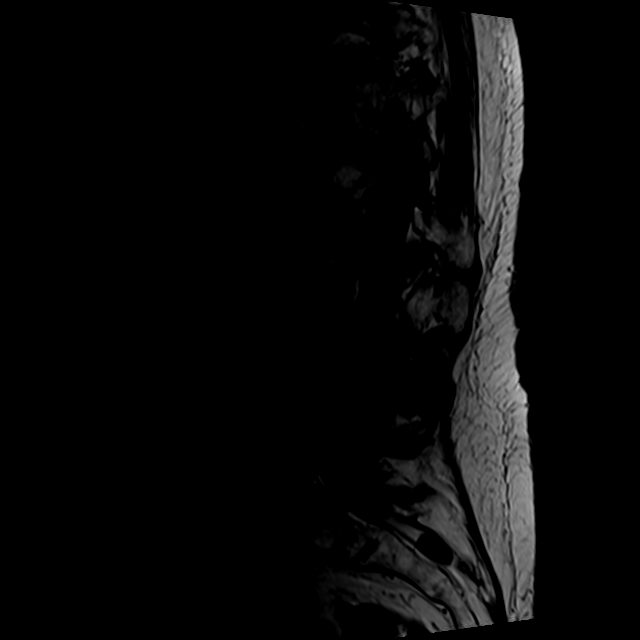
[im 13/19]
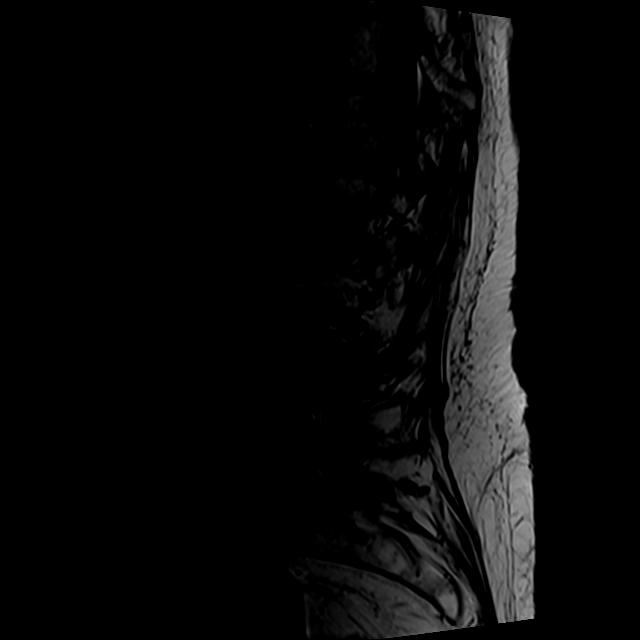
[im 16/19]
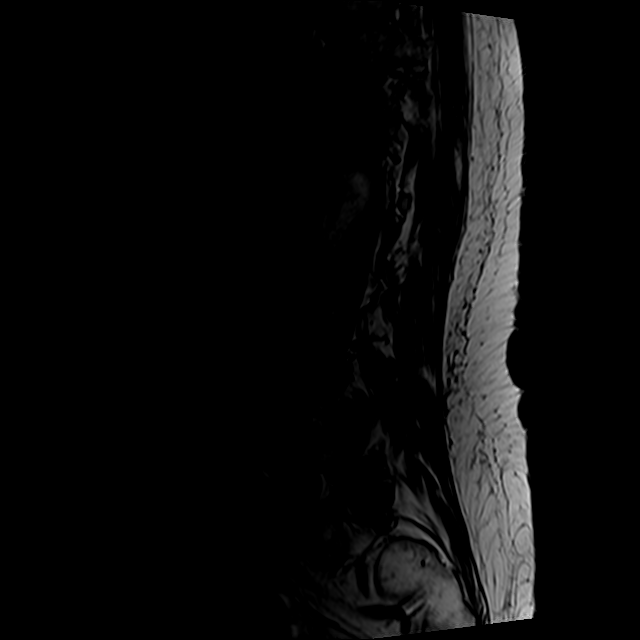
[im 19/19]
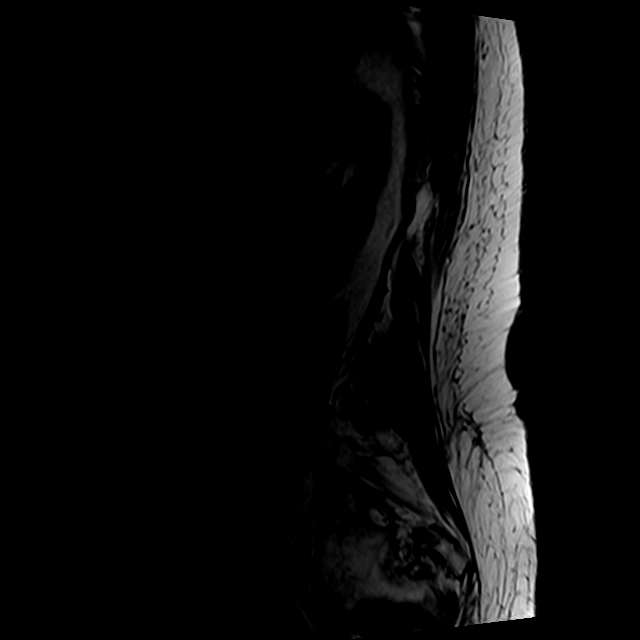

[Series 5: T2 · axial · 4.0mm · 0.78mm/px · z∈[-113,+88]mm · 8 of 37 slices shown (2 of 2)]
[im 1/37]
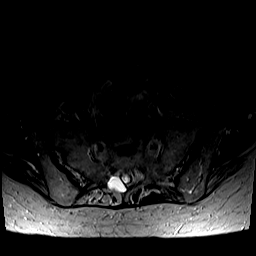
[im 6/37]
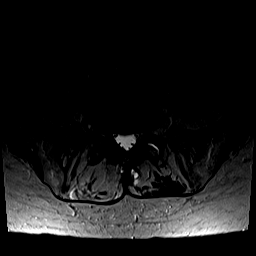
[im 12/37]
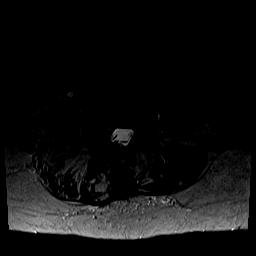
[im 17/37]
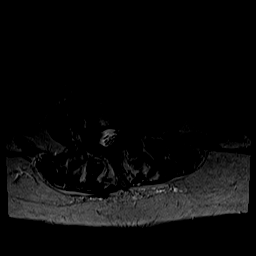
[im 20/37]
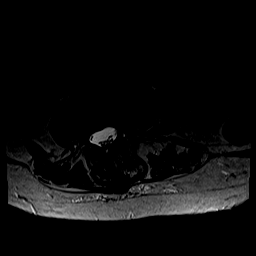
[im 25/37]
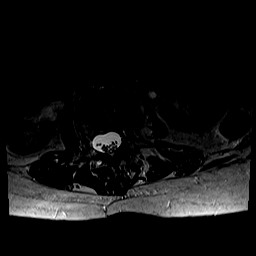
[im 31/37]
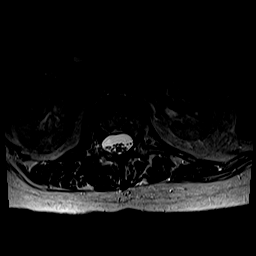
[im 37/37]
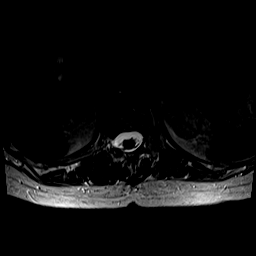

[Series 6: T1 · axial · 4.0mm · 0.39mm/px · z∈[-88,+59]mm · 3 of 37 slices shown (2 of 2)]
[im 6/37]
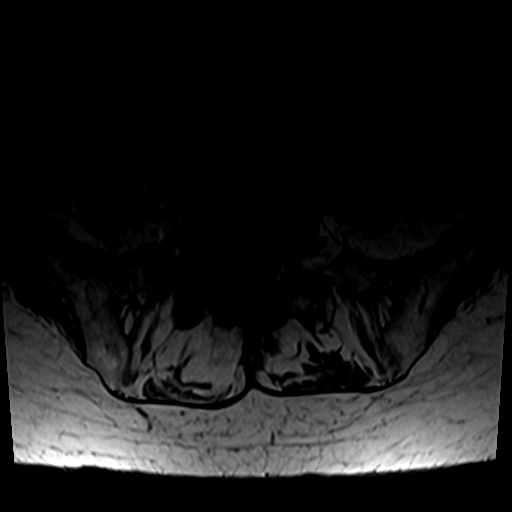
[im 20/37]
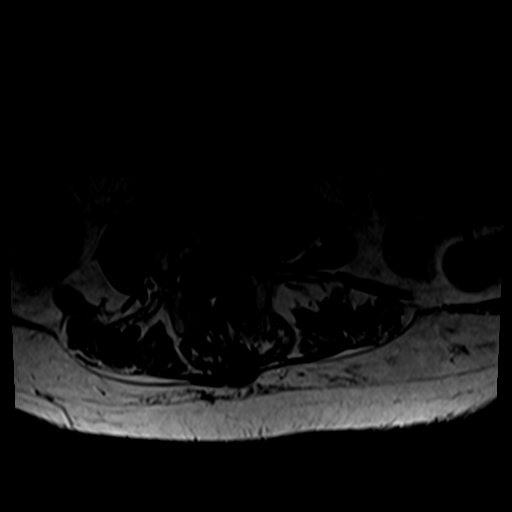
[im 31/37]
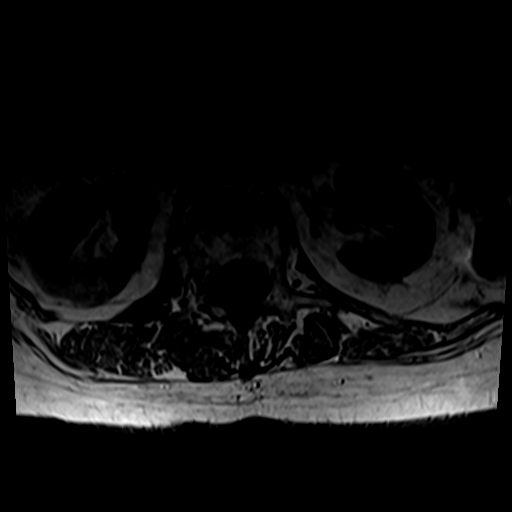

[24 of 48 positions shown; findings below may reference images not displayed]

FINDINGS: Segmentation:  Standard.

Alignment: Dextroscoliosis centered at L2-L3 disc space. 5 mm
retrolisthesis at L2 and L3.

Vertebrae: No fracture, evidence of discitis, or bone lesion. Modic
type 2 changes at L2-L3, L3-L4 and L4-L5.

Conus medullaris and cauda equina: Conus extends to the L1 level.
Conus and cauda equina appear normal.

Paraspinal and other soft tissues: Generalized atrophy wasting of
the paraspinal muscles with mild to moderate fatty infiltration.
Small Tarlov cyst at S2 on the right.

Disc spaces:

T12-L1: No significant disc bulge. No neural foraminal stenosis. No
central canal stenosis.

L1-L2: Asymmetric circumferential disc protrusion to the right.
Ligamentum flavum hypertrophy and moderate spinal canal narrowing.
Mild left and moderate right neural foraminal narrowing.

L2-L3: Disc osteophyte complex and ligamentum flavum hypertrophy
with moderate narrowing of spinal canal. Moderate right and severe
left neural foraminal narrowing.

L3-L4: Disc extrusion with moderate narrowing of spinal canal on the
right with severe right neural foraminal narrowing.

L4-L5: Circumferential disc protrusion and facet joint arthropathy
on the right with severe right neural foraminal narrowing. Moderate
left neural foraminal narrowing.

L5-S1: Circumferential disc protrusion with moderate to severe
bilateral neural foraminal narrowing. Prominent facet joint
arthropathy right worse than the left.
IMPRESSION: 1. Advanced multilevel degenerate disc disease as detailed above
with spinal canal and neural foraminal narrowing at multiple levels.
2. No acute fracture. Retrolisthesis at L2 and L3 and prominent
dextroscoliosis centered at L2-L3 disc space.

## 2023-05-08 ENCOUNTER — Other Ambulatory Visit: Payer: Self-pay | Admitting: Physical Medicine & Rehabilitation

## 2023-05-08 DIAGNOSIS — G8929 Other chronic pain: Secondary | ICD-10-CM

## 2023-05-08 DIAGNOSIS — M5416 Radiculopathy, lumbar region: Secondary | ICD-10-CM

## 2023-05-14 DIAGNOSIS — R413 Other amnesia: Secondary | ICD-10-CM | POA: Insufficient documentation

## 2023-05-19 ENCOUNTER — Ambulatory Visit
Admission: RE | Admit: 2023-05-19 | Discharge: 2023-05-19 | Disposition: A | Discharge status: Home / Self Care | Payer: Medicare Other | Source: Ambulatory Visit | Attending: Physical Medicine & Rehabilitation | Admitting: Physical Medicine & Rehabilitation

## 2023-05-19 DIAGNOSIS — G8929 Other chronic pain: Secondary | ICD-10-CM

## 2023-05-19 DIAGNOSIS — M5416 Radiculopathy, lumbar region: Secondary | ICD-10-CM

## 2023-06-06 NOTE — Progress Notes (Signed)
 Referring Physician:  Dodson Delon FERNS, MD [address] [place],  [place] [address]  Primary Physician:  Izora Sima, MD  History of Present Illness: 06/07/2023 [name] is here today with a chief complaint of right knee pain and muscle loss.  Her right knee gives out at times.  Her pain can be as bad as 7 out of 10 and is worse in her back and on the lateral aspect of her right upper leg.  She has no pain or tingling below her knee.  She has no numbness.  She had her right knee replaced in 2021.  She has had knee pain since that time.  She underwent surgery in June 2021 with a lumbar decompression for left leg pain.  She did very well from that.  Walking and standing make her pain worse.  She also feels like her knee will give out at times.  Bowel/Bladder Dysfunction: none  Conservative measures:  Physical therapy: participated in at [facility] from 10/04/22 to 11/01/22 Multimodal medical therapy including regular antiinflammatories:  gabapentin Injections:  04/20/23: Left L5-S1 TF ESI (75% relief) 01/12/23: Left L5-S1 TF ESI (50% relief) 09/28/22: Left L5-S1 TF ESI (90% relief) 06/02/22: Left L5-S1 TF ESI (80% relief)   Past Surgery:  11/12/19: L3-L5 Decompression (by me)  Emma Houston has no symptoms of cervical myelopathy.  The symptoms are causing a significant impact on the patient's life.   I have utilized the care everywhere function in epic to review the outside records available from external health systems.  Review of Systems:  A 10 point review of systems is negative, except for the pertinent positives and negatives detailed in the HPI.  Past Medical History: Past Medical History:  Diagnosis Date   Anemia    Arthritis    RA   Chronic back pain    Chronic kidney disease    frequent kidney infections   Coronary artery disease    Depression    GERD (gastroesophageal reflux disease)    Hyperlipidemia    Hypertension     Insomnia    Stroke (HCC)    TIA    Past Surgical History: Past Surgical History:  Procedure Laterality Date   ABDOMINAL HYSTERECTOMY     FRACTURE SURGERY Bilateral    plate on left   JOINT REPLACEMENT Right    knee   JOINT REPLACEMENT Left    KNEE ARTHROSCOPY Left    LUMBAR LAMINECTOMY/DECOMPRESSION MICRODISCECTOMY N/A 11/12/2019   Procedure: L3-5 DECOMPRESSION;  Surgeon: Clois Fret, MD;  Location: [facility] ORS;  Service: Neurosurgery;  Laterality: N/A;    Allergies: Allergies as of 06/07/2023   (No Known Allergies)    Medications:  Current Outpatient Medications:    amLODipine (NORVASC) 2.5 MG tablet, Take 2.5 mg by mouth daily., Disp: , Rfl:    buPROPion (WELLBUTRIN XL) 300 MG 24 hr tablet, Take 300 mg by mouth daily., Disp: , Rfl:    busPIRone (BUSPAR) 5 MG tablet, Take 1 tablet by mouth 2 (two) times daily., Disp: , Rfl:    carvedilol  (COREG ) 3.125 MG tablet, Take 1 tablet (3.125 mg total) by mouth 2 (two) times daily with a meal., Disp: 60 tablet, Rfl: 0   clobetasol cream (TEMOVATE) 0.05 %, Apply topically 2 (two) times daily., Disp: , Rfl:    hydrOXYzine (ATARAX) 50 MG tablet, Take by mouth., Disp: , Rfl:    Multiple Vitamins-Minerals (MULTIVITAMIN WITH MINERALS) tablet, Take 1 tablet by mouth daily., Disp: , Rfl:  nitroGLYCERIN  (NITROSTAT ) 0.4 MG SL tablet, Place 1 tablet (0.4 mg total) under the tongue every 5 (five) minutes as needed for chest pain., Disp: 20 tablet, Rfl: 0   rosuvastatin (CRESTOR) 10 MG tablet, Take 10 mg by mouth daily., Disp: , Rfl:    traZODone  (DESYREL ) 50 MG tablet, Take 50 mg by mouth at bedtime., Disp: , Rfl:    triamcinolone  ointment (KENALOG ) 0.1 %, Apply topically., Disp: , Rfl:   Current Facility-Administered Medications:    triamcinolone  acetonide (KENALOG -40) injection 40 mg, 40 mg, Other, Once, Hyatt, Max T, DPM  Social History: Social History   Tobacco Use   Smoking status: Former    Current packs/day: 0.00    Types:  Cigarettes    Quit date: 1982    Years since quitting: 43.0   Smokeless tobacco: Never  Vaping Use   Vaping status: Never Used  Substance Use Topics   Alcohol use: Yes    Alcohol/week: 1.0 standard drink of alcohol    Types: 1 Cans of beer per week    Comment: occassional   Drug use: Yes    Comment: prescribed for chronic back pain    Family Medical History: History reviewed. No pertinent family history.  Physical Examination: Vitals:   06/07/23 1116  BP: 116/68    General: Patient is in no apparent distress. Attention to examination is appropriate.  Neck:   Supple.  Full range of motion.  Respiratory: Patient is breathing without any difficulty.   NEUROLOGICAL:     Awake, alert, oriented to person, place, and time.  Speech is clear and fluent.   Cranial Nerves: Pupils equal round and reactive to light.  Facial tone is symmetric.  Facial sensation is symmetric. Shoulder shrug is symmetric. Tongue protrusion is midline.  There is no pronator drift.  Strength: Side Biceps Triceps Deltoid Interossei Grip Wrist Ext. Wrist Flex.  R 5 5 5 5 5 5 5   L 5 5 5 5 5 5 5    Side Iliopsoas Quads Hamstring PF DF EHL  R 5 5 5 5 5 5   L 5 5 5 5 5 5    Reflexes are 2+ and symmetric at the biceps, triceps, brachioradialis, patella and achilles.   Hoffman's is absent.   Bilateral upper and lower extremity sensation is intact to light touch.    No evidence of dysmetria noted.  Gait is antalgic.  She has some tenderness around the medial aspect of her right knee.  She has some pain with range of motion of her right knee.  Her muscle bulk shows a loss of muscle in her right thigh.   Medical Decision Making  Imaging: MRI L spine 05/19/2023 IMPRESSION: 1. Overall unchanged lumbar spondylosis, with compression of the traversing left L3 nerve root in the subarticular zone at L2-L3, displacement of the traversing right L5 nerve root in the subarticular zone at L4-L5, and compression  of the traversing right S1 nerve root in the lateral recess at L5-S1. 2. Severe neural foraminal narrowing on the right at L3-L4 and L4-L5 and bilaterally at L5-S1.     Electronically Signed   By: Ryan Chess M.D.   On: 06/01/2023 15:23  I have personally reviewed the images and agree with the above interpretation.  Assessment and Plan: Ms. Camarena is a pleasant 83 y.o. female with possible neurogenic claudication or lumbar radiculopathy.  She does have areas of compression.  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  I am concerned that  her right knee pain is not due to her back issue.  She does have some muscle wasting, so I would like to get a nerve conduction study.  If that shows active radicular findings, we will discuss lumbar decompression.  She does have severe lateral recess compression from L3-4 to L5-S1.  We discussed that spinal cord stimulation is also an option.  I have given her some information regarding this.  We will touch base once we have her nerve conduction study results.  I spent a total of 30 minutes in this patient's care today. This time was spent reviewing pertinent records including imaging studies, obtaining and confirming history, performing a directed evaluation, formulating and discussing my recommendations, and documenting the visit within the medical record.    Thank you for involving me in the care of this patient.      Emma Houston K. Clois MD, [facility] Neurosurgery

## 2023-06-07 ENCOUNTER — Encounter: Payer: Self-pay | Admitting: Neurosurgery

## 2023-06-07 ENCOUNTER — Ambulatory Visit: Payer: Medicare Other | Admitting: Neurosurgery

## 2023-06-07 VITALS — BP 116/68 | Ht 64.5 in | Wt 132.0 lb

## 2023-06-07 DIAGNOSIS — M419 Scoliosis, unspecified: Secondary | ICD-10-CM

## 2023-06-07 DIAGNOSIS — M48062 Spinal stenosis, lumbar region with neurogenic claudication: Secondary | ICD-10-CM

## 2023-06-07 DIAGNOSIS — M79604 Pain in right leg: Secondary | ICD-10-CM

## 2023-06-07 DIAGNOSIS — M25561 Pain in right knee: Secondary | ICD-10-CM | POA: Diagnosis not present

## 2023-06-07 DIAGNOSIS — M4316 Spondylolisthesis, lumbar region: Secondary | ICD-10-CM

## 2023-06-12 ENCOUNTER — Encounter: Payer: Self-pay | Admitting: Neurosurgery

## 2023-06-12 DIAGNOSIS — M48062 Spinal stenosis, lumbar region with neurogenic claudication: Secondary | ICD-10-CM

## 2023-06-12 DIAGNOSIS — M79604 Pain in right leg: Secondary | ICD-10-CM

## 2023-06-12 DIAGNOSIS — M4316 Spondylolisthesis, lumbar region: Secondary | ICD-10-CM

## 2023-06-14 ENCOUNTER — Other Ambulatory Visit: Payer: Self-pay

## 2023-06-14 DIAGNOSIS — R202 Paresthesia of skin: Secondary | ICD-10-CM

## 2023-06-21 ENCOUNTER — Ambulatory Visit: Payer: Medicare Other | Admitting: Neurology

## 2023-06-21 DIAGNOSIS — R202 Paresthesia of skin: Secondary | ICD-10-CM

## 2023-06-21 NOTE — Procedures (Signed)
  [facility]  [address] [place], [address]  [place], [place] [address] Tel: [phone] Fax: [phone] Test Date:  06/21/2023  Patient: Emma Houston DOB: 08-06-1940 Physician: Nita Sickle, DO  Sex: Female Height: 5\' 4"  Ref Phys: Venetia Night, MD  ID#: [id number]   Technician:    History: This is a 83 year old female referred for evaluation of right leg pain and gait instability.  NCV & EMG Findings: Extensive electrodiagnostic testing of the right lower extremity shows:  Right sural and superficial peroneal sensory responses are within normal limits. Right peroneal motor response is absent at the extensor digitorum brevis and reduced at the tibialis anterior (R 2.4 mV).  Right tibial motor response shows reduced amplitude (R2.9 mV). Right tibial H reflex study is within normal limits. Chronic motor axon loss changes are seen affecting the right L4, L5, and S1 myotomes.  There is no evidence of accompanying active denervation.  Additionally, there is no evidence of fibrillation potentials in the lumbar paraspinal muscles.  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 There is no evidence of a large fiber sensorimotor polyneuropathy or diffuse myopathy.   ___________________________ Nita Sickle, DO    Nerve Conduction Studies   Stim Site NR Peak (ms) Norm Peak (ms) O-P Amp (V) Norm O-P Amp  Right Sup Peroneal Anti Sensory (Ant Lat Mall)  32 C  12 cm    2.7 <4.6 5.0 >3  Right Sural Anti Sensory (Lat Mall)  32 C  Calf    2.8 <4.6 6.2 >3     Stim Site NR Onset (ms) Norm Onset (ms) O-P Amp (mV) Norm O-P Amp Site1 Site2 Delta-0 (ms) Dist (cm) Vel (m/s) Norm Vel (m/s)  Right Peroneal Motor (Ext Dig Brev)  32 C  Ankle *NR  <6.0  >2.5 B Fib Ankle  0.0  >40  B Fib *NR     Poplt B Fib  0.0  >40  Poplt *NR            Right Peroneal TA Motor (Tib Ant)  32 C  Fib Head    3.2 <4.5 *2.4 >3  Poplit Fib Head 1.6 8.0 50 >40  Poplit    4.8 <5.7 2.4         Right Tibial Motor (Abd Hall Brev)  32 C  Ankle    3.8 <6.0 *2.9 >4 Knee Ankle 8.4 42.0 50 >40  Knee    12.2  2.0          Electromyography   Side Muscle Ins.Act Fibs Fasc Recrt Amp Dur Poly Activation Comment  Right AntTibialis Nml Nml Nml *2- *1+ *1+ *1+ Nml N/A  Right Gastroc Nml Nml Nml *2- *1+ *1+ *1+ Nml N/A  Right Flex Dig Long Nml Nml Nml *2- *1+ *1+ *1+ Nml N/A  Right RectFemoris Nml Nml Nml *2- *1+ *1+ *1+ Nml N/A  Right BicepsFemS Nml Nml Nml *1- *1+ *1+ *1+ Nml N/A  Right GluteusMed Nml Nml Nml *1- *1+ *1+ *1+ Nml N/A  Right AdductorLong Nml Nml Nml Nml Nml Nml Nml Nml N/A  Right Lumbo Parasp Low Nml Nml Nml NE - - - NE N/A      Waveforms:

## 2023-06-22 ENCOUNTER — Ambulatory Visit
Admission: RE | Admit: 2023-06-22 | Discharge: 2023-06-22 | Disposition: A | Discharge status: Home / Self Care | Payer: Medicare Other | Source: Ambulatory Visit | Attending: Neurosurgery | Admitting: Neurosurgery

## 2023-06-22 DIAGNOSIS — M48062 Spinal stenosis, lumbar region with neurogenic claudication: Secondary | ICD-10-CM

## 2023-06-22 DIAGNOSIS — M4316 Spondylolisthesis, lumbar region: Secondary | ICD-10-CM

## 2023-06-22 DIAGNOSIS — M79604 Pain in right leg: Secondary | ICD-10-CM | POA: Diagnosis present

## 2023-06-25 ENCOUNTER — Ambulatory Visit: Payer: Medicare Other | Admitting: Podiatry

## 2023-06-28 ENCOUNTER — Encounter: Payer: Self-pay | Admitting: Neurosurgery

## 2023-06-28 NOTE — Telephone Encounter (Addendum)
I tried to call patient to confirm if she had completed her clearance with psychology for her spinal cord stimulator. Also to pass on Dr. Lucienne Capers recommendation to for the patient's Grandson. Her daughter answered the phone and was driving but said that they will call the office back.

## 2023-06-28 NOTE — Telephone Encounter (Signed)
I will request psych eval from [facility]

## 2023-06-28 NOTE — Telephone Encounter (Signed)
Patty spoke with Patient's daughter and relied my message.

## 2023-06-29 NOTE — Telephone Encounter (Signed)
Psych note from [facility] has been scanned.

## 2023-07-03 ENCOUNTER — Other Ambulatory Visit: Payer: Self-pay | Admitting: Neurosurgery

## 2023-07-03 ENCOUNTER — Telehealth: Payer: Self-pay

## 2023-07-03 MED ORDER — LIDOCAINE 5 % EX PTCH: 1.0000 | MEDICATED_PATCH | Freq: Two times a day (BID) | CUTANEOUS | 0 refills | Status: AC

## 2023-07-03 NOTE — Telephone Encounter (Signed)
Received authorization request from Covermymeds for Lidocaine patches. Request has been submitted (Key [id number]) and is approved through 05/28/24. Approval letter has been faxed to [facility].

## 2023-07-11 ENCOUNTER — Ambulatory Visit: Payer: Medicare Other | Admitting: Podiatry

## 2023-07-11 ENCOUNTER — Encounter: Payer: Self-pay | Admitting: Podiatry

## 2023-07-11 DIAGNOSIS — M2041 Other hammer toe(s) (acquired), right foot: Secondary | ICD-10-CM | POA: Diagnosis not present

## 2023-07-11 NOTE — Progress Notes (Signed)
She presents today with her daughter status post tenotomy procedure toes 2 and 3 of the right foot.  She states that they still feel like they are having pain and particularly under here for there is a small bump or nodule.  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  The small bump that she is feeling is actually scar tissue and is freely movable as the skin moves.  This is not deep and should resolve momentarily.  Assessment: Hammertoe deformity.  Plan: We did discuss the possible need for surgical intervention if she wanted these toes any straighter than what they are.  She understands that and is amenable to it we will follow-up with me on an as needed basis.

## 2023-07-12 ENCOUNTER — Ambulatory Visit
Admission: RE | Admit: 2023-07-12 | Discharge: 2023-07-12 | Disposition: A | Discharge status: Home / Self Care | Payer: Medicare Other | Source: Ambulatory Visit | Attending: Student in an Organized Health Care Education/Training Program | Admitting: Student in an Organized Health Care Education/Training Program

## 2023-07-12 ENCOUNTER — Ambulatory Visit
Payer: Medicare Other | Attending: Student in an Organized Health Care Education/Training Program | Admitting: Student in an Organized Health Care Education/Training Program

## 2023-07-12 ENCOUNTER — Encounter: Payer: Self-pay | Admitting: Student in an Organized Health Care Education/Training Program

## 2023-07-12 VITALS — BP 160/65 | HR 50 | Temp 97.2°F | Resp 14 | Ht 64.0 in | Wt 127.0 lb

## 2023-07-12 DIAGNOSIS — M961 Postlaminectomy syndrome, not elsewhere classified: Secondary | ICD-10-CM | POA: Diagnosis present

## 2023-07-12 DIAGNOSIS — M5416 Radiculopathy, lumbar region: Secondary | ICD-10-CM | POA: Diagnosis present

## 2023-07-12 DIAGNOSIS — G8929 Other chronic pain: Secondary | ICD-10-CM | POA: Insufficient documentation

## 2023-07-12 DIAGNOSIS — Z96651 Presence of right artificial knee joint: Secondary | ICD-10-CM | POA: Diagnosis present

## 2023-07-12 DIAGNOSIS — G894 Chronic pain syndrome: Secondary | ICD-10-CM | POA: Diagnosis present

## 2023-07-12 DIAGNOSIS — M25561 Pain in right knee: Secondary | ICD-10-CM | POA: Insufficient documentation

## 2023-07-12 NOTE — Patient Instructions (Signed)
Please give soap solution

## 2023-07-12 NOTE — Progress Notes (Signed)
Patient: Emma Houston  Service Category: E/M  Provider: Edward Jolly, MD  DOB: Jul 03, 1940  DOS: 07/12/2023  Referring Provider: Venetia Night, MD  MRN: [id number]  Setting: Ambulatory outpatient  PCP: No primary care provider on file.  Type: New Patient  Specialty: Interventional Pain Management    Location: Office  Delivery: Face-to-face     Primary Reason(s) for Visit: Encounter for initial evaluation of one or more chronic problems (new to examiner) potentially causing chronic pain, and posing a threat to normal musculoskeletal function. (Level of risk: High) CC: Back Pain (lower)  HPI  Emma Houston is a 83 y.o. year old, female patient, who comes for the first time to our practice referred by Emma Night, MD for our initial evaluation of her chronic pain. She has Shortness of breath; Elevated troponin; Pain of both hip joints; Anxiety; At risk for delirium; At risk for falls; Benign essential hypertension; Depression; Diverticulitis; Chronic neck pain; GERD (gastroesophageal reflux disease); H/O TIA (transient ischemic attack) and stroke; Hyperlipidemia; Hyponatremia; Insomnia; Instability of right knee joint; Iron deficiency anemia; Arthritis; Osteoarthritis of right midfoot; Presence of right artificial knee joint; DDD (degenerative disc disease), lumbar; Stage 3a chronic kidney disease (HCC); TIA (transient ischemic attack); Aftercare following joint replacement; Memory changes; Rash; Lumbar post-laminectomy syndrome; Chronic radicular lumbar pain; Chronic pain syndrome; and Chronic knee pain after total replacement of right knee joint on their problem list. Today she comes in for evaluation of her Back Pain (lower)  Pain Assessment: Location: Lower Back Radiating: right leg to just below the knee Onset: More than a month ago Duration: Chronic pain Quality: Sharp Severity: 6 /10 (subjective, self-reported pain score)  Effect on ADL: difficulty lifting and bending Timing:  Constant Modifying factors: Tylenol, lying down BP: (!) 160/65  HR: (!) 50  Onset and Duration: Present longer than 3 months Cause of pain: Work related accident or event Severity: NAS-11 at its worse: 9/10, NAS-11 at its best: 2/10, NAS-11 now: 5/10, and NAS-11 on the average: 6/10 Timing: Afternoon, [place], During activity or exercise, and After activity or exercise Aggravating Factors: Bending, Climbing, Kneeling, Lifiting, Motion, Prolonged sitting, Prolonged standing, Squatting, Stooping , Twisting, Walking, Walking uphill, and Walking downhill Alleviating Factors: Hot packs, Lying down, Medications, Nerve blocks, Resting, and Using a brace Associated Problems: Depression, Dizziness, Inability to concentrate, Inability to control bladder (urine), Swelling, Tingling, Weakness, Pain that wakes patient up, and Pain that does not allow patient to sleep Quality of Pain: Aching, Agonizing, Annoying, Constant, Cruel, Deep, Distressing, Dreadful, Exhausting, Horrible, Nagging, Pulsating, Punishing, Sharp, Throbbing, Tingling, Tiring, and Uncomfortable Previous Examinations or Tests: MRI scan, X-rays, Nerve conduction test, Neurological evaluation, and Chiropractic evaluation Previous Treatments: Chiropractic manipulations, Epidural steroid injections, Physical Therapy, and Strengthening exercises  History of Present Illness   Emma Houston "Emma Houston" is an 83 year old female with chronic back and leg pain who presents for evaluation and management of her pain.  She has chronic back and leg pain, primarily affecting the right leg, which has persisted despite undergoing lumbar spine surgery in 2021. This was a lumbar decompression. She continues to experience significant pain in her back and right leg, which limits her mobility and daily activities. Various treatments, including epidural injections, have provided limited relief. Her scoliosis and arthritis contribute to her discomfort, and she has a  perceived leg length discrepancy.  She underwent a knee replacement in 2021, prior to the spine surgery, and has experienced continuous pain in the knee since then. Mechanical evaluations have confirmed  the integrity of the knee replacement, yet the pain persists. She has been evaluated by Dr. Ernest Houston, who confirmed the mechanical integrity of the knee replacement.  She is referred here from Dr. Marcell Houston to consider spinal cord stimulation.      Meds   Current Outpatient Medications:    amLODipine (NORVASC) 2.5 MG tablet, Take 2.5 mg by mouth daily., Disp: , Rfl:    buPROPion (WELLBUTRIN XL) 300 MG 24 hr tablet, Take 300 mg by mouth daily., Disp: , Rfl:    busPIRone (BUSPAR) 5 MG tablet, Take 1 tablet by mouth 2 (two) times daily., Disp: , Rfl:    carvedilol (COREG) 3.125 MG tablet, Take 1 tablet (3.125 mg total) by mouth 2 (two) times daily with a meal., Disp: 60 tablet, Rfl: 0   lidocaine (LIDODERM) 5 %, Place 1 patch onto the skin every 12 (twelve) hours. Remove & Discard patch within 12 hours or as directed by MD, Disp: 10 patch, Rfl: 0   nitroGLYCERIN (NITROSTAT) 0.4 MG SL tablet, Place 1 tablet (0.4 mg total) under the tongue every 5 (five) minutes as needed for chest pain., Disp: 20 tablet, Rfl: 0   rosuvastatin (CRESTOR) 10 MG tablet, Take 10 mg by mouth daily., Disp: , Rfl:    traZODone (DESYREL) 50 MG tablet, Take 50 mg by mouth at bedtime., Disp: , Rfl:    clobetasol cream (TEMOVATE) 0.05 %, Apply topically 2 (two) times daily. (Patient not taking: Reported on 07/12/2023), Disp: , Rfl:    hydrOXYzine (ATARAX) 50 MG tablet, Take by mouth. (Patient not taking: Reported on 07/12/2023), Disp: , Rfl:    Multiple Vitamins-Minerals (MULTIVITAMIN WITH MINERALS) tablet, Take 1 tablet by mouth daily. (Patient not taking: Reported on 07/12/2023), Disp: , Rfl:    triamcinolone ointment (KENALOG) 0.1 %, Apply topically. (Patient not taking: Reported on 07/12/2023), Disp: , Rfl:   Current  Facility-Administered Medications:    triamcinolone acetonide (KENALOG-40) injection 40 mg, 40 mg, Other, Once, Emma Houston, Emma Houston, DPM  Imaging Review   MR THORACIC SPINE WO CONTRAST  Narrative CLINICAL DATA:  Back and leg pain.  EXAM: MRI THORACIC SPINE WITHOUT CONTRAST  TECHNIQUE: Multiplanar, multisequence MR imaging of the thoracic spine was performed. No intravenous contrast was administered.  COMPARISON:  None Available.  FINDINGS: Alignment: Thoracolumbar scoliosis but normal alignment in the sagittal plane.  Vertebrae: Mild endplate reactive changes mainly at T8-9 and T9-10 but no worrisome bone lesions or fractures. Moderate multilevel facet disease  Cord:  Normal cord signal intensity.  No cord lesions or syrinx.  Paraspinal and other soft tissues: No significant paraspinal or posterior mediastinal or posterior lung abnormalities.  Disc levels:  Shallow bilateral disc osteophyte complexes at T4-5, T5-6, T6-7 and T7-8. Mild mass effect on both sides of the thecal sac but no significant spinal or foraminal stenosis.  Right-sided disc osteophyte complexes at T7-8 and T8-9 with mild mass effect on the right side of thecal sac.  IMPRESSION: 1. Thoracolumbar scoliosis but normal alignment in the sagittal plane. 2. Shallow bilateral disc osteophyte complexes at T4-5, T5-6, T6-7 and T7-8. Mild mass effect on both sides of the thecal sac but no significant spinal or foraminal stenosis. 3. Right-sided disc osteophyte complexes at T7-8 and T8-9 with mild mass effect on the right side of thecal sac. 4. Normal MR appearance of the thoracic spinal cord.   Electronically Signed By: Rudie Meyer M.D. On: 07/03/2023 18:02   MR LUMBAR SPINE WO CONTRAST  Narrative CLINICAL DATA:  Lumbar  radiculitis. Chronic bilateral low back pain with left-sided sciatica.  EXAM: MRI LUMBAR SPINE WITHOUT CONTRAST  TECHNIQUE: Multiplanar, multisequence MR imaging of the lumbar  spine was performed. No intravenous contrast was administered.  COMPARISON:  Lumbar spine MRI 06/01/2021.  FINDINGS: Segmentation: Conventional numbering is assumed with 5 non-rib-bearing, lumbar type vertebral bodies.  Alignment: Unchanged dextroscoliotic curvature of the lumbar spine. Unchanged 3 mm stair step retrolisthesis of L2 on L3 and L3 on L4. Unchanged mild grade 1 anterolisthesis of L5 on S1.  Vertebrae: Multilevel degenerative endplate marrow signal changes, including Modic type 1 changes from L1-L4.  Conus medullaris and cauda equina: Conus extends to the L1 level. Conus and cauda equina appear normal.  Paraspinal and other soft tissues: Moderate fatty atrophy of the paraspinal muscles.  Disc levels:  Overall unchanged lumbar spondylosis, as detailed below.  T12-L1:  Unremarkable.  L1-L2: Small, left eccentric disc bulge and mild bilateral facet arthropathy.  L2-L3: Left eccentric disc bulge and facet arthropathy results in compression of the traversing left L3 nerve root in the subarticular zone and moderate left neural foraminal narrowing.  L3-L4: Right eccentric disc bulge and facet arthropathy results in severe right and moderate left neural foraminal narrowing.  L4-L5: Right eccentric disc bulge and facet arthropathy results in displacement of the traversing right L5 nerve root in the subarticular zone in severe right neural foraminal narrowing.  L5-S1: Anterolisthesis with uncovered disc and right-greater-than-left facet arthropathy results in compression of the traversing right S1 nerve root in the lateral recess and severe bilateral neural foraminal narrowing.  IMPRESSION: 1. Overall unchanged lumbar spondylosis, with compression of the traversing left L3 nerve root in the subarticular zone at L2-L3, displacement of the traversing right L5 nerve root in the subarticular zone at L4-L5, and compression of the traversing right S1 nerve root in the  lateral recess at L5-S1. 2. Severe neural foraminal narrowing on the right at L3-L4 and L4-L5 and bilaterally at L5-S1.   Electronically Signed By: Orvan Falconer M.D. On: 06/01/2023 15:23  DG Lumbar Spine 2-3 Views  Narrative CLINICAL DATA:  Intraoperative localization  EXAM: LUMBAR SPINE - 2-3 VIEW; DG C-ARM 1-60 MIN  COMPARISON:  None.  FINDINGS: A series of lumbar images obtained. Note that there is 9 mm of retrolisthesis of L3 on L4. There is 7 mm of anterolisthesis of L4 on L5. No fracture evident. Images show probes intermittently posterior to the L3-4, L4-5, and L5 S1 levels. On image labeled 9/9, metallic probe tip is posterior to the L3-4 level.  IMPRESSION: Images intermittently show probe tips posterior to L3-4, L4-5, L5-S1. On the image labeled 9/9 which is the final submitted image, metallic probe tip is posterior to the L3-4 level. No spondylolisthesis noted at L3-4 and L4-5. No fracture appreciable.   Electronically Signed By: Bretta Bang III M.D. On: 11/12/2019 10:25    Complexity Note: Imaging results reviewed.                         ROS  Cardiovascular: High blood pressure Pulmonary or Respiratory: No reported pulmonary signs or symptoms such as wheezing and difficulty taking a deep full breath (Asthma), difficulty blowing air out (Emphysema), coughing up mucus (Bronchitis), persistent dry cough, or temporary stoppage of breathing during sleep Neurological: Incontinence:  Urinary Psychological-Psychiatric: Anxiousness, Depressed, and Difficulty sleeping and or falling asleep Gastrointestinal: No reported gastrointestinal signs or symptoms such as vomiting or evacuating blood, reflux, heartburn, alternating episodes of diarrhea and  constipation, inflamed or scarred liver, or pancreas or irrregular and/or infrequent bowel movements Genitourinary: Kidney disease Hematological: No reported hematological signs or symptoms such as prolonged  bleeding, low or poor functioning platelets, bruising or bleeding easily, hereditary bleeding problems, low energy levels due to low hemoglobin or being anemic Endocrine: No reported endocrine signs or symptoms such as high or low blood sugar, rapid heart rate due to high thyroid levels, obesity or weight gain due to slow thyroid or thyroid disease Rheumatologic: Joint aches and or swelling due to excess weight (Osteoarthritis) Musculoskeletal: Negative for myasthenia gravis, muscular dystrophy, multiple sclerosis or malignant hyperthermia Work History: Retired  Allergies  Ms. Widmann has no known allergies.  Laboratory Chemistry Profile   Renal Lab Results  Component Value Date   BUN 31 (H) 11/04/2019   CREATININE 1.46 (H) 11/04/2019   GFRAA 39 (L) 11/04/2019   GFRNONAA 34 (L) 11/04/2019   PROTEINUR NEGATIVE 11/04/2019     Electrolytes Lab Results  Component Value Date   NA 137 11/04/2019   K 3.7 11/04/2019   CL 101 11/04/2019   CALCIUM 9.2 11/04/2019   MG 2.1 02/26/2019     Hepatic Lab Results  Component Value Date   AST 25 02/25/2019   ALT 17 02/25/2019   ALBUMIN 4.5 02/25/2019   ALKPHOS 68 02/25/2019   LIPASE 29 02/25/2019     ID Lab Results  Component Value Date   SARSCOV2NAA NEGATIVE 11/10/2019   STAPHAUREUS NEGATIVE 11/04/2019   MRSAPCR NEGATIVE 11/04/2019     Bone No results found for: "VD25OH", "VD125OH2TOT", "XB1478GN5", "AO1308MV7", "25OHVITD1", "25OHVITD2", "25OHVITD3", "TESTOFREE", "TESTOSTERONE"   Endocrine Lab Results  Component Value Date   GLUCOSE 98 11/04/2019   GLUCOSEU NEGATIVE 11/04/2019   HGBA1C 5.5 02/25/2019   TSH 1.847 02/25/2019     Neuropathy Lab Results  Component Value Date   HGBA1C 5.5 02/25/2019     CNS No results found for: "COLORCSF", "APPEARCSF", "RBCCOUNTCSF", "WBCCSF", "POLYSCSF", "LYMPHSCSF", "EOSCSF", "PROTEINCSF", "GLUCCSF", "JCVIRUS", "CSFOLI", "IGGCSF", "LABACHR", "ACETBL"   Inflammation (CRP: Acute  ESR:  Chronic) No results found for: "CRP", "ESRSEDRATE", "LATICACIDVEN"   Rheumatology No results found for: "RF", "ANA", "LABURIC", "URICUR", "LYMEIGGIGMAB", "LYMEABIGMQN", "HLAB27"   Coagulation Lab Results  Component Value Date   INR 1.0 11/04/2019   LABPROT 13.1 11/04/2019   APTT 28 11/04/2019   PLT 248 11/04/2019     Cardiovascular Lab Results  Component Value Date   HGB 11.8 (L) 11/04/2019   HCT 36.2 11/04/2019     Screening Lab Results  Component Value Date   SARSCOV2NAA NEGATIVE 11/10/2019   STAPHAUREUS NEGATIVE 11/04/2019   MRSAPCR NEGATIVE 11/04/2019     Cancer No results found for: "CEA", "CA125", "LABCA2"   Allergens No results found for: "ALMOND", "APPLE", "ASPARAGUS", "AVOCADO", "BANANA", "BARLEY", "BASIL", "BAYLEAF", "GREENBEAN", "LIMABEAN", "WHITEBEAN", "BEEFIGE", "REDBEET", "BLUEBERRY", "BROCCOLI", "CABBAGE", "MELON", "CARROT", "CASEIN", "CASHEWNUT", "CAULIFLOWER", "CELERY"     Note: Lab results reviewed.  PFSH  Drug: Ms. Zahn  reports current drug use. Alcohol:  reports current alcohol use of about 1.0 standard drink of alcohol per week. Tobacco:  reports that she quit smoking about 43 years ago. Her smoking use included cigarettes. She has never used smokeless tobacco. Medical:  has a past medical history of Anemia, Arthritis, Chronic back pain, Chronic kidney disease, Coronary artery disease, Depression, GERD (gastroesophageal reflux disease), Hyperlipidemia, Hypertension, Insomnia, and Stroke (HCC). Family: family history is not on file.  Past Surgical History:  Procedure Laterality Date   ABDOMINAL HYSTERECTOMY  FRACTURE SURGERY Bilateral    plate on left   JOINT REPLACEMENT Right    knee   JOINT REPLACEMENT Left    KNEE ARTHROSCOPY Left    LUMBAR LAMINECTOMY/DECOMPRESSION MICRODISCECTOMY N/A 11/12/2019   Procedure: L3-5 DECOMPRESSION;  Surgeon: Emma Night, MD;  Location: [facility] ORS;  Service: Neurosurgery;  Laterality: N/A;   Active  Ambulatory Problems    Diagnosis Date Noted   Shortness of breath    Elevated troponin    Pain of both hip joints 09/02/2019   Anxiety 06/04/2019   At risk for delirium 06/05/2019   At risk for falls 06/05/2019   Benign essential hypertension 03/04/2013   Depression 01/12/2014   Diverticulitis 08/29/2012   Chronic neck pain 09/02/2019   GERD (gastroesophageal reflux disease) 08/29/2012   H/O TIA (transient ischemic attack) and stroke 06/04/2019   Hyperlipidemia 08/29/2012   Hyponatremia 06/05/2019   Insomnia 03/04/2013   Instability of right knee joint 06/04/2019   Iron deficiency anemia 06/05/2019   Arthritis 07/27/2013   Osteoarthritis of right midfoot 06/17/2019   Presence of right artificial knee joint 08/18/2019   DDD (degenerative disc disease), lumbar 07/21/2014   Stage 3a chronic kidney disease (HCC) 06/05/2019   TIA (transient ischemic attack) 08/29/2012   Aftercare following joint replacement 08/18/2019   Memory changes 05/14/2023   Rash 07/04/2022   Lumbar post-laminectomy syndrome 07/12/2023   Chronic radicular lumbar pain 07/12/2023   Chronic pain syndrome 07/12/2023   Chronic knee pain after total replacement of right knee joint 07/12/2023   Resolved Ambulatory Problems    Diagnosis Date Noted   Chest pain 02/25/2019   Past Medical History:  Diagnosis Date   Anemia    Chronic back pain    Chronic kidney disease    Coronary artery disease    Hypertension    Stroke [facility])    Constitutional Exam  General appearance: Well nourished, well developed, and well hydrated. In no apparent acute distress Vitals:   07/12/23 0955  BP: (!) 160/65  Pulse: (!) 50  Resp: 14  Temp: (!) 97.2 F (36.2 C)  TempSrc: Temporal  SpO2: 100%  Weight: 127 lb (57.6 kg)  Height: 5\' 4"  (1.626 m)   BMI Assessment: Estimated body mass index is 21.8 kg/m as calculated from the following:   Height as of this encounter: 5\' 4"  (1.626 m).   Weight as of this encounter: 127 lb  (57.6 kg).  BMI interpretation table: BMI level Category Range association with higher incidence of chronic pain  <18 kg/m2 Underweight   18.5-24.9 kg/m2 Ideal body weight   25-29.9 kg/m2 Overweight Increased incidence by 20%  30-34.9 kg/m2 Obese (Class I) Increased incidence by 68%  35-39.9 kg/m2 Severe obesity (Class II) Increased incidence by 136%  >40 kg/m2 Extreme obesity (Class III) Increased incidence by 254%   Patient's current BMI Ideal Body weight  Body mass index is 21.8 kg/m. Ideal body weight: 54.7 kg (120 lb 9.5 oz) Adjusted ideal body weight: 55.9 kg (123 lb 2.5 oz)   BMI Readings from Last 4 Encounters:  07/12/23 21.80 kg/m  06/07/23 22.31 kg/m  11/12/19 22.31 kg/m  10/31/19 21.97 kg/m   Wt Readings from Last 4 Encounters:  07/12/23 127 lb (57.6 kg)  06/07/23 132 lb (59.9 kg)  10/31/19 132 lb (59.9 kg)  02/26/19 139 lb 6.4 oz (63.2 kg)    Psych/Mental status: Alert, oriented x 3 (person, place, & time)       Eyes: PERLA Respiratory: No evidence of acute  respiratory distress  Thoracic Spine Area Exam  Skin & Axial Inspection: No masses, redness, or swelling Alignment: Asymmetric Functional ROM: Unrestricted ROM Stability: No instability detected Muscle Tone/Strength: Functionally intact. No obvious neuro-muscular anomalies detected. Sensory (Neurological): Unimpaired Muscle strength & Tone: No palpable anomalies Lumbar Spine Area Exam  Skin & Axial Inspection: Well healed scar from previous spine surgery detected Alignment: Scoliosis detected Functional ROM: Pain restricted ROM affecting primarily the right Stability: No instability detected Muscle Tone/Strength: Functionally intact. No obvious neuro-muscular anomalies detected. Sensory (Neurological): Dermatomal pain pattern right Palpation: No palpable anomalies       Provocative Tests: Hyperextension/rotation test: deferred today       Lumbar quadrant test (Kemp's test): (+) on the right for  foraminal stenosis Lateral bending test: (+) ipsilateral radicular pain, on the right. Positive for right-sided foraminal stenosis.  Gait & Posture Assessment  Ambulation: Unassisted Gait: Relatively normal for age and body habitus Posture: WNL  Lower Extremity Exam    Side: Right lower extremity  Side: Left lower extremity  Stability: No instability observed          Stability: No instability observed          Skin & Extremity Inspection: Skin color, temperature, and hair growth are WNL. No peripheral edema or cyanosis. No masses, redness, swelling, asymmetry, or associated skin lesions. No contractures.  Skin & Extremity Inspection: Evidence of prior arthroplastic surgery, atrophy of quadricep muscles  Functional ROM: Unrestricted ROM                  Functional ROM: Pain restricted ROM for hip and knee joints          Muscle Tone/Strength: Functionally intact. No obvious neuro-muscular anomalies detected.  Muscle Tone/Strength: Functionally intact. No obvious neuro-muscular anomalies detected.  Sensory (Neurological): Unimpaired        Sensory (Neurological): Neurogenic pain pattern        DTR: Patellar: deferred today Achilles: deferred today Plantar: deferred today  DTR: Patellar: deferred today Achilles: deferred today Plantar: deferred today  Palpation: No palpable anomalies  Palpation: No palpable anomalies    Assessment  Primary Diagnosis & Pertinent Problem List: The primary encounter diagnosis was Failed back surgical syndrome. Diagnoses of Lumbar post-laminectomy syndrome, Chronic radicular lumbar pain, Chronic pain syndrome, and Chronic knee pain after total replacement of right knee joint were also pertinent to this visit.  Visit Diagnosis (New problems to examiner): 1. Failed back surgical syndrome   2. Lumbar post-laminectomy syndrome   3. Chronic radicular lumbar pain   4. Chronic pain syndrome   5. Chronic knee pain after total replacement of right knee joint     Plan of Care (Initial workup plan)      Chronic Back Pain with Radiculopathy, Post laminectomy pain syndrome Chronic back pain with radiculopathy affects the right leg, persisting after spine surgery in 2021. Previous epidural injections offered limited relief. A spinal cord stimulation trial is recommended as a less invasive alternative to further surgery.   I explained to pt how a spinal cord stimulation (SCS) is indicated for chronic, intractable pain, especially in cases of failed back surgery syndrome, radiculopathy, or pain syndromes that affect broader areas such as the back and legs.  SCS devices provide continuous electrical impulses to the spinal cord, modulating pain signals before they reach the brain.  We discussed the potential benefits and risks of spinal cord stimulation. Benefits: can provide substantial relief from chronic pain, long-term therapy with programmable settings, often reduces  the need for oral medications, including opioids, some patients experience reduced dependency on other pain interventions. Risks (including but not limited to): Surgical risks, including infection, lead migration/fracture, and hardware malfunction, long-term implantation requires ongoing maintenance and possible reoperation, variable effectiveness: some patients do not experience sufficient pain relief.  The patient has completed her psych eval.  I was able to evaluate her interlaminar windows under live fluoroscopy.  She does have scoliosis in her thoracolumbar region as well as osteophytes which could make percutaneous access difficult however I believe we should be able to get in at least 1-lead if not 2.  Chronic Knee Pain Post-Knee Replacement Chronic knee pain persists following knee replacement surgery in 2021. Mechanical evaluation by Dr. Ernest Houston confirms the replacement is intact. Consider GENICULAR nerve block and potential ablation as treatment options. These procedures are suitable for  patients with knee replacements and cannot target the joint directly due to its artificial nature. Consider a nerve block for knee pain and discuss potential ablation if the nerve block is ineffective.  Follow-up Schedule a follow-up appointment next month for the spinal cord stimulation trial. Evaluate the trial's effectiveness and consider permanent implantation if successful.       Imaging Orders         DG PAIN CLINIC C-ARM 1-60 MIN NO REPORT     Procedure Orders         [place] TRIAL     Future considerations: Genicular nerve block for knee pain, Possible adductor canal block and stim  Provider-requested follow-up: Return in about 4 weeks (around 08/09/2023) for [employer] SCS trial.  No future appointments.  Duration of encounter: .  Total time on encounter, as per AMA guidelines included both the face-to-face and non-face-to-face time personally spent by the physician and/or other qualified health care professional(s) on the day of the encounter (includes time in activities that require the physician or other qualified health care professional and does not include time in activities normally performed by clinical staff). Physician's time may include the following activities when performed: Preparing to see the patient (e.g., pre-charting review of records, searching for previously ordered imaging, lab work, and nerve conduction tests) Review of prior analgesic pharmacotherapies. Reviewing PMP Interpreting ordered tests (e.g., lab work, imaging, nerve conduction tests) Performing post-procedure evaluations, including interpretation of diagnostic procedures Obtaining and/or reviewing separately obtained history Performing a medically appropriate examination and/or evaluation Counseling and educating the patient/family/caregiver Ordering medications, tests, or procedures Referring and communicating with other health care professionals (when not separately reported) Documenting  clinical information in the electronic or other health record Independently interpreting results (not separately reported) and communicating results to the patient/ family/caregiver Care coordination (not separately reported)  Note by: Emma Jolly, MD (AI and TTS technology used. I apologize for any typographical errors that were not detected and corrected.) Date: 07/12/2023; Time: 11:26 AM

## 2023-09-04 ENCOUNTER — Telehealth: Payer: Self-pay | Admitting: Student in an Organized Health Care Education/Training Program

## 2023-09-04 NOTE — Telephone Encounter (Signed)
 Called patient and gave daughter instructions for pre procedure via voicemail.  Instructed to call for any further questions or concerns.

## 2023-09-04 NOTE — Telephone Encounter (Signed)
 Patient lost instructions, Daughter Nettie Elm is calling asking to have them emailed to her or call to give instructions for SCS Trial Tomorrow.  Nettie Elm [phone]

## 2023-09-05 ENCOUNTER — Ambulatory Visit
Attending: Student in an Organized Health Care Education/Training Program | Admitting: Student in an Organized Health Care Education/Training Program

## 2023-09-05 ENCOUNTER — Encounter: Payer: Self-pay | Admitting: Student in an Organized Health Care Education/Training Program

## 2023-09-05 ENCOUNTER — Ambulatory Visit
Admission: RE | Admit: 2023-09-05 | Discharge: 2023-09-05 | Disposition: A | Discharge status: Home / Self Care | Source: Ambulatory Visit | Attending: Student in an Organized Health Care Education/Training Program | Admitting: Student in an Organized Health Care Education/Training Program

## 2023-09-05 DIAGNOSIS — G894 Chronic pain syndrome: Secondary | ICD-10-CM | POA: Diagnosis present

## 2023-09-05 DIAGNOSIS — G8929 Other chronic pain: Secondary | ICD-10-CM | POA: Diagnosis present

## 2023-09-05 DIAGNOSIS — M961 Postlaminectomy syndrome, not elsewhere classified: Secondary | ICD-10-CM

## 2023-09-05 DIAGNOSIS — M5416 Radiculopathy, lumbar region: Secondary | ICD-10-CM | POA: Diagnosis present

## 2023-09-05 MED ORDER — FENTANYL CITRATE (PF) 100 MCG/2ML IJ SOLN
25.0000 ug | INTRAMUSCULAR | Status: DC | PRN
  Administered 2023-09-05: 50 ug via INTRAVENOUS

## 2023-09-05 MED ORDER — MIDAZOLAM HCL 5 MG/5ML IJ SOLN
0.5000 mg | Freq: Once | INTRAMUSCULAR | Status: AC
  Administered 2023-09-05: 2 mg via INTRAVENOUS

## 2023-09-05 MED ORDER — CEFAZOLIN SODIUM-DEXTROSE 2-4 GM/100ML-% IV SOLN
2.0000 g | INTRAVENOUS | Status: AC
  Administered 2023-09-05: 2 g via INTRAVENOUS
  Filled 2023-09-05: qty 100

## 2023-09-05 MED ORDER — MIDAZOLAM HCL 5 MG/5ML IJ SOLN
INTRAMUSCULAR | Status: AC
  Filled 2023-09-05: qty 5

## 2023-09-05 MED ORDER — CEFAZOLIN SODIUM 1 G IJ SOLR
INTRAMUSCULAR | Status: AC
  Filled 2023-09-05: qty 20

## 2023-09-05 MED ORDER — FENTANYL CITRATE (PF) 100 MCG/2ML IJ SOLN
INTRAMUSCULAR | Status: AC
  Filled 2023-09-05: qty 2

## 2023-09-05 MED ORDER — ROPIVACAINE HCL 2 MG/ML IJ SOLN
9.0000 mL | Freq: Once | INTRAMUSCULAR | Status: AC
  Administered 2023-09-05: 9 mL via PERINEURAL

## 2023-09-05 MED ORDER — ROPIVACAINE HCL 2 MG/ML IJ SOLN
INTRAMUSCULAR | Status: AC
  Filled 2023-09-05: qty 20

## 2023-09-05 MED ORDER — LIDOCAINE HCL 2 % IJ SOLN
20.0000 mL | Freq: Once | INTRAMUSCULAR | Status: AC
  Administered 2023-09-05: 400 mg

## 2023-09-05 MED ORDER — LIDOCAINE HCL 2 % IJ SOLN
INTRAMUSCULAR | Status: AC
  Filled 2023-09-05: qty 20

## 2023-09-05 MED ORDER — CEPHALEXIN 500 MG PO CAPS: 500.0000 mg | ORAL_CAPSULE | Freq: Four times a day (QID) | ORAL | 0 refills | Status: AC

## 2023-09-05 MED ORDER — LACTATED RINGERS IV SOLN: Freq: Once | INTRAVENOUS | Status: AC

## 2023-09-05 NOTE — Progress Notes (Signed)
 PROVIDER NOTE: Interpretation of information contained herein should be left to medically-trained personnel. Specific patient instructions are provided elsewhere under "Patient Instructions" section of medical record. This document was created in part using STT-dictation technology, any transcriptional errors that may result from this process are unintentional.  Patient: Emma Houston Type: Established DOB: 11/22/1940 MRN: [id number] PCP: No primary care provider on file.  Service: Procedure DOS: 09/05/2023 Setting: Ambulatory Location: Ambulatory outpatient facility Delivery: Face-to-face Provider: Edward Jolly, MD Specialty: Interventional Pain Management Specialty designation: 09 Location: Outpatient facility Ref. Prov.: Pendem, Constance Haw, MD       Interventional Therapy   Primary Reason for Admission: Surgical management of chronic pain condition.   Procedure:              Type: MEDTRONIC Trial Spinal Cord Neurostimulator Implant (Percutaneous, interlaminar, posterior epidural placement) Laterality: Bilateral (-50)  Level: Lumbar  Imaging: Fluoroscopic guidance Anesthesia: Local anesthesia (1-2% Lidocaine) Sedation: Moderate Sedation                       DOS: 09/05/2023  Performed by: Edward Jolly, MD  Purpose: Diagnostic. To determine if a permanent implant may be effective in controlling some or all of Emma Houston's chronic pain symptoms.  Rationale (medical necessity): procedure needed and proper for the diagnosis and/or treatment of Emma Houston's medical symptoms and needs. 1. Failed back surgical syndrome   2. Lumbar post-laminectomy syndrome   3. Chronic radicular lumbar pain   4. Chronic pain syndrome    NAS-11 Pain score:   Pre-procedure: 3 /10   Post-procedure: 0-No pain/10     Target: Posterior epidural space over the dorsal columns of the spinal cord. Location: Posterior intraspinal canal Region: Thoracolumbar  Approach: Translaminar percutaneous  Type of  procedure: Surgical   Position / Prep / Materials:  Position: Prone  Prep solution: ChloraPrep (2% chlorhexidine gluconate and 70% isopropyl alcohol) Prep Area: Entire  Posterior  Thoracolumbar  Region  Materials:  Tray: Implant tray Needle(s):  Type: Epidural  Gauge (G):  14   Length: Regular (10cm)  Qty: 2  H&P (Pre-op Assessment):  Emma Houston is a 83 y.o. (year old), female patient, seen today for interventional treatment. She  has a past surgical history that includes Abdominal hysterectomy; Knee arthroscopy (Left); Joint replacement (Right); Joint replacement (Left); Fracture surgery (Bilateral); and Lumbar laminectomy/decompression microdiscectomy (N/A, 11/12/2019).  Initial Vital Signs:  Pulse/EKG Rate: (!) 52ECG Heart Rate: (!) 54 (sb) Temp: 97.8 F (36.6 C) Resp: 14 BP: (!) 175/77 SpO2: 100 %  BMI: Estimated body mass index is 21.11 kg/m as calculated from the following:   Height as of this encounter: 5\' 4"  (1.626 m).   Weight as of this encounter: 123 lb (55.8 kg).  Risk Assessment: Allergies: Reviewed. She has no known allergies.  Allergy Precautions: None required Coagulopathies: Reviewed. None identified.  Blood-thinner therapy: None at this time Active Infection(s): Reviewed. None identified. Emma Houston is afebrile  Site Confirmation: Emma Houston was asked to confirm the procedure and laterality before marking the site, which she did. Procedure checklist: Completed Consent: Before the procedure and under the influence of no sedative(s), amnesic(s), or anxiolytics, the patient was informed of the treatment options, risks and possible complications. To fulfill our ethical and legal obligations, as recommended by the American Medical Association's Code of Ethics, I have informed the patient of my clinical impression; the nature and purpose of the treatment or procedure; the risks, benefits, and possible complications of the intervention;  the alternatives, including doing  nothing; the risk(s) and benefit(s) of the alternative treatment(s) or procedure(s); and the risk(s) and benefit(s) of doing nothing.  Emma Houston was provided with information about the general risks and possible complications associated with most interventional procedures. These include, but are not limited to: failure to achieve desired goals, infection, bleeding, organ or nerve damage, allergic reactions, paralysis, and/or death.  In addition, she was informed of those risks and possible complications associated to this particular procedure, which include, but are not limited to: damage to the implant; failure to decrease pain; local, systemic, or serious CNS infections, intraspinal abscess with possible cord compression and paralysis, or life-threatening such as meningitis; intrathecal and/or epidural bleeding with formation of hematoma with possible spinal cord compression and permanent paralysis; organ damage; nerve injury or damage with subsequent sensory, motor, and/or autonomic system dysfunction, resulting in transient or permanent pain, numbness, and/or weakness of one or several areas of the body; allergic reactions, either minor or major life-threatening, such as anaphylactic or anaphylactoid reactions.  Furthermore, Emma Houston was informed of those risks and complications associated with the medications. These include, but are not limited to: allergic reactions (i.e.: anaphylactic or anaphylactoid reactions); arrhythmia;  Hypotension/hypertension; cardiovascular collapse; respiratory depression and/or shortness of breath; swelling or edema; medication-induced neural toxicity; particulate matter embolism and blood vessel occlusion with resultant organ, and/or nervous system infarction and permanent paralysis.  Finally, she was informed that Medicine is not an exact science; therefore, there is also the possibility of unforeseen or unpredictable risks and/or possible complications that may result  in a catastrophic outcome. The patient indicated having understood very clearly. We have given the patient no guarantees and we have made no promises. Enough time was given to the patient to ask questions, all of which were answered to the patient's satisfaction. Ms. Lomeli has indicated that she wanted to continue with the procedure. Attestation: I, the ordering provider, attest that I have discussed with the patient the benefits, risks, side-effects, alternatives, likelihood of achieving goals, and potential problems during recovery for the procedure that I have provided informed consent. Date  Time: 09/05/2023  8:07 AM  Pre-Procedure Preparation:  Monitoring: As per clinic protocol. Respiration, ETCO2, SpO2, BP, heart rate and rhythm monitor placed and checked for adequate function Safety Precautions: Patient was assessed for positional comfort and pressure points before starting the procedure. Time-out: I initiated and conducted the "Time-out" before starting the procedure, as per protocol. The patient was asked to participate by confirming the accuracy of the "Time Out" information. Verification of the correct person, site, and procedure were performed and confirmed by me, the nursing staff, and the patient. "Time-out" conducted as per Joint Commission's Universal Protocol (UP.01.01.01). Time: 0902 Start Time: 0902 hrs.  Description/Narrative of Procedure:          Rationale (medical necessity): procedure needed and proper for the diagnosis and/or treatment of the patient's medical symptoms and needs. Procedural Technique Safety Precautions: Aspiration looking for blood return was conducted prior to all injections. At no point did we inject any substances, as a needle was being advanced. No attempts were made at seeking any paresthesias. Safe injection practices and needle disposal techniques used. Medications properly checked for expiration dates. SDV (single dose vial) medications  used. Description of the Procedure: Protocol guidelines were followed. The patient was assisted into a comfortable position. The target area was identified and the area prepped in the usual manner. Skin & deeper tissues infiltrated with local anesthetic. Appropriate amount  of time allowed to pass for local anesthetics to take effect. The procedure needles were then advanced to the target area. Proper needle placement secured. Negative aspiration confirmed. Solution injected in intermittent fashion, asking for systemic symptoms every 0.5cc of injectate. The needles were then removed and the area cleansed, making sure to leave some of the prepping solution back to take advantage of its long term bactericidal properties.  Technical description of procedure: Availability of a responsible, adult driver, and NPO status confirmed. Informed consent was obtained after having discussed risks and possible complications. An IV was started. The patient was then taken to the fluoroscopy suite, where the patient was placed in position for the procedure, over the fluoroscopy table. The patient was then monitored in the usual manner. Fluoroscopy was manipulated to obtain the best possible view of the target. Parallex error was corrected before commencing the procedure. Once a clear view of the target had been obtained, the skin and deeper tissues over the procedure site were infiltrated using lidocaine, loaded in a 10 cc luer-loc syringe with a 0.5 inch, 25-G needle. The introducer needle(s) was/were then inserted through the skin and deeper tissues. A paramidline approach was used to enter the posterior epidural space at a 30 angle, using "Loss-of-resistance Technique" with 3 ml of PF-NaCl (0.9% NSS). Correct needle placement was confirmed in the antero-posterior and lateral fluoroscopic views. The lead was gently introduced and manipulated under real-time fluoroscopy, constantly assessing for pain, discomfort, or  paresthesias, until the tip rested at the desired level. Both sides were done in identical fashion. Electrode placement was tested until appropriate coverage was attained. Once the patient confirmed that the stimulation was over the desired area, the lead(s) was/were secured in place and the introducer needles removed. This was done under real-time fluoroscopy while observing the electrode tip to avoid unintended migration. The area was covered with a non-occlusive dressing and the patient transported to recovery for further programming.  Vitals:   09/05/23 0940 09/05/23 0950 09/05/23 1000 09/05/23 1010  BP: (!) 128/58 (!) 135/59 (!) 134/59 (!) 115/53  Pulse:      Resp: 17 12 10 12   Temp:      TempSrc:      SpO2: 100% 98% 97% 99%  Weight:      Height:        Start Time: 0902 hrs. End Time:   hrs.  Neurostimulator Details:  Lead(s):  Brand: Medtronic         Epidural Access Level:  T12-L1 T12-L1  Lead implant:  Bilateral   No. of Electrodes/Lead:  8 8  Laterality:  Left Right  Top electrode location:  T8 T8  Bottom electrode location:  T10 T8  Model No.: [id number] Same  Length: 60cm Same  Lot No.: [id number]   Imaging Guidance (Spinal):          Type of Imaging Technique: Fluoroscopy Guidance (Spinal) Indication(s): Fluoroscopy guidance for needle placement to enhance accuracy in procedures requiring precise needle localization for targeted delivery of medication in or near specific anatomical locations not easily accessible without such real-time imaging assistance. Exposure Time: Please see nurses notes. Contrast: None used. Fluoroscopic Guidance: I was personally present during the use of fluoroscopy. "Tunnel Vision Technique" used to obtain the best possible view of the target area. Parallax error corrected before commencing the procedure. "Direction-depth-direction" technique used to introduce the needle under continuous pulsed fluoroscopy. Once target was reached,  antero-posterior, oblique, and lateral fluoroscopic projection used confirm needle placement in  all planes. Images permanently stored in EMR. Interpretation: No contrast injected. I personally interpreted the imaging intraoperatively. Adequate needle placement confirmed in multiple planes. Permanent images saved into the patient's record.      Antibiotic Prophylaxis:   Anti-infectives (From admission, onward)    Start     Dose/Rate Route Frequency Ordered Stop   09/05/23 0856  ceFAZolin (ANCEF) IVPB 2g/100 mL premix        2 g 200 mL/hr over 30 Minutes Intravenous 30 min pre-op 09/05/23 0856 09/05/23 0855   09/05/23 0000  cephALEXin (KEFLEX) 500 MG capsule        500 mg Oral 4 times daily 09/05/23 0836 09/12/23 2359      Indication(s): Implant Prophylaxis.  Post-operative Assessment:  Post-procedure Vital Signs:  Pulse/HCG Rate: (!) 52(!) 52 Temp: 97.8 F (36.6 C) Resp: 12 BP: (!) 115/53 SpO2: 99 %  Complications: No immediate post-treatment complications observed by team, or reported by patient.  Note: The patient tolerated the entire procedure well. A repeat set of vitals were taken after the procedure and the patient was kept under observation following institutional policy, for this type of procedure. Post-procedural neurological assessment was performed, showing return to baseline, prior to discharge. The patient was provided with post-procedure discharge instructions, including a section on how to identify potential problems. Should any problems arise concerning this procedure, the patient was given instructions to immediately contact us, at any time, without hesitation. In any case, we plan to contact the patient by telephone for a follow-up status report regarding this interventional procedure.  Comments:  No additional relevant information.  Plan of Care  Orders:  Orders Placed This Encounter  Procedures   DG PAIN [facility] C-ARM 1-60 MIN NO REPORT    Intraoperative  interpretation by procedural physician at [facility].    Standing Status:   Standing    Number of Occurrences:   1    Reason for exam::   Assistance in needle guidance and placement for procedures requiring needle placement in or near specific anatomical locations not easily accessible without such assistance.    Medications administered: We administered lidocaine, lactated ringers, midazolam, fentaNYL, ropivacaine (PF) 2 mg/mL (0.2%), and ceFAZolin.  See the medical record for exact dosing, route, and time of administration.  Follow-up plan:   Return in about 1 week (around 09/12/2023) for SCS lead pull.       Recent Visits Date Type Provider Dept  07/12/23 Office Visit Edward Jolly, MD [facility]  Showing recent visits within past 90 days and meeting all other requirements Today's Visits Date Type Provider Dept  09/05/23 Procedure visit Edward Jolly, MD [facility]  Showing today's visits and meeting all other requirements Future Appointments Date Type Provider Dept  09/12/23 Appointment Edward Jolly, MD [facility]  Showing future appointments within next 90 days and meeting all other requirements  Disposition: Discharge home  Discharge (Date  Time): 09/05/2023; 1020 hrs.   Primary Care Physician: No primary care provider on file. Location: [facility] Note by: Edward Jolly, MD (TTS technology used. I apologize for any typographical errors that were not detected and corrected.) Date: 09/05/2023; Time: 11:43 AM

## 2023-09-05 NOTE — Patient Instructions (Addendum)
 Today we did the following -We have done a Spinal Cord Stimulator Trial with Medtronic  -As long as the leads are in place, do not bathe or shower. You may sponge bathe.  -While the lead is in place, please limit the bending, lifting, or twisting because the lead can move.  -The things we want to see is if your pain improves (and by what percentage), if you can do more activity (don't overdo it), and if you can use less of your "as needed" medicine. Do not stop long acting medicines like methadone, oxycontin, MS Contin, etc without checking with [place].  -It is VERY important that you pick up the antibiotics we prescribed, Keflex, on your way home from the trial and take them as prescribed(4 times a day), starting today, for as long as the lead is in place.  -The Spina Cord Stimulator Representative will be in contact with you while the lead is in place to make sure the trial goes as well as possible.  -Please contact us with any questions or concerns at any time during the trial.   -If you start running a fever over 100 degrees, have severe back pain, or new pain running down the legs, or drainage coming from the lead site, contact us immediately and/or go to the emergency room.  -Please do not restart any sort of medication that can thin your blood such as Aspirin, ibuprofen, motrin, aleve, plavix, coumadin, etc. If you aren't sure, call and ask.  -We will have you return next Wed to have the lead removed. If this is successful, at that point we can go over the details about the permanent implant.   PATIENT DISCHARGE INSTRUCTIONS FOLLOWING SPINAL CORD STIMULATOR TRIAL IMPLANT  You will be discharged from the clinic on the same day as your surgery, after you are fully recovered. Make arrangements to be driven home. DO NOT DRIVE YOURSELF!!  The bandage over the side incision may drain a small amount of blood. This is normal; don't be alarmed. Be certain to review all warnings/precautions in  the patient brochure accompanying your stimulator. Please call the Pain Clinic to make an appointment for follow?up care.  Instructions: Food intake: Start with clear liquids (like water) and advance to regular food, as tolerated.  Physical activities:  No restrictions during the trial period except for submerging in water above the implant area. Driving: You can start to drive again when you are fully recovered from the effects of the sedation (24 hours). Blood thinner: If you take a blood thinner, restart it 6 hours after your procedure. (Only for those taking blood thinners) Insulin: If you use insulin, as soon as you can eat, you may resume your normal dosing schedule. (Only for those taking insulin) Wound dressing care: Keep procedure site clean and dry. Clean wound with alcohol, once a day for the first 7 days, starting 48 hours after the surgery.  Keep incisions clean and dry. Cover with plastic wrap and tape when showering. Do not submerge incisions in a bathtub, pool or spa. Follow-up appointment: Keep your follow-up appointment after the procedure. Usually 6-7 days to remove trial leads.   Expect: From numbing medicine (AKA: Local Anesthetics): Numbness or decrease in pain. Duration: On the average, 1 to 8 hours.  From procedure: Some discomfort is to be expected once the numbing medicine wears off. This should be minimal. Use your regular pain medicines.   Call if: You experience numbness and weakness that gets worse with time, as  opposed to wearing off. New onset bowel or bladder incontinence. develop a temperature over 101.5? are unable to urinate have increasing difficulty walking or using your legs have incisional pain that continues to increase rather than stabilize or improve have any drainage of pus from your incisions have drainage which completely saturates the bandage  Emergency Numbers: Durning business hours (Monday - Thursday, 8:00 AM - 4:00 PM) (Friday, 9:00 AM  - 12:00 Noon): [phone] After hours: [phone] ____________________________________________________________________________________________  Moderate Conscious Sedation, Adult, Care After  After the procedure, it is common to have: Sleepiness for a few hours. Impaired judgment for a few hours. Trouble with balance. Nausea or vomiting if you eat too soon. Follow these instructions at home: For the time period you were told by your health care provider:  Rest. Do not participate in activities where you could fall or become injured. Do not drive or use machinery. Do not drink alcohol. Do not take sleeping pills or medicines that cause drowsiness. Do not make important decisions or sign legal documents. Do not take care of children on your own. Eating and drinking Follow instructions from your health care provider about what you may eat and drink. Drink enough fluid to keep your urine pale yellow. If you vomit: Drink clear fluids slowly and in small amounts as you are able. Clear fluids include water, ice chips, low-calorie sports drinks, and fruit juice that has water added to it (diluted fruit juice). Eat light and bland foods in small amounts as you are able. These foods include bananas, applesauce, rice, lean meats, toast, and crackers. General instructions Take over-the-counter and prescription medicines only as told by your health care provider. Have a responsible adult stay with you for the time you are told. Do not use any products that contain nicotine or tobacco. These products include cigarettes, chewing tobacco, and vaping devices, such as e-cigarettes. If you need help quitting, ask your health care provider. Return to your normal activities as told by your health care provider. Ask your health care provider what activities are safe for you. Your health care provider may give you more instructions. Make sure you know what you can and cannot do. Contact a health  care provider if: You are still sleepy or having trouble with balance after 24 hours. You feel light-headed. You vomit every time you eat or drink. You get a rash. You have a fever. You have redness or swelling around the IV site. Get help right away if: You have trouble breathing. You start to feel confused at home. These symptoms may be an emergency. Get help right away. Call 911. Do not wait to see if the symptoms will go away. Do not drive yourself to the hospital. This information is not intended to replace advice given to you by your health care provider. Make sure you discuss any questions you have with your health care provider. Document Revised: 11/28/2021 Document Reviewed: 11/28/2021 Elsevier Patient Education  2024 [employer].

## 2023-09-06 ENCOUNTER — Telehealth: Payer: Self-pay

## 2023-09-06 NOTE — Telephone Encounter (Signed)
 No issues post-procedure.

## 2023-09-12 ENCOUNTER — Ambulatory Visit
Admission: RE | Admit: 2023-09-12 | Discharge: 2023-09-12 | Disposition: A | Discharge status: Home / Self Care | Source: Ambulatory Visit | Attending: Student in an Organized Health Care Education/Training Program | Admitting: Student in an Organized Health Care Education/Training Program

## 2023-09-12 ENCOUNTER — Ambulatory Visit
Attending: Student in an Organized Health Care Education/Training Program | Admitting: Student in an Organized Health Care Education/Training Program

## 2023-09-12 ENCOUNTER — Encounter: Payer: Self-pay | Admitting: Student in an Organized Health Care Education/Training Program

## 2023-09-12 VITALS — BP 173/96 | HR 64 | Temp 96.8°F | Resp 16 | Ht 64.0 in | Wt 123.0 lb

## 2023-09-12 DIAGNOSIS — G8929 Other chronic pain: Secondary | ICD-10-CM

## 2023-09-12 DIAGNOSIS — G894 Chronic pain syndrome: Secondary | ICD-10-CM

## 2023-09-12 DIAGNOSIS — M961 Postlaminectomy syndrome, not elsewhere classified: Secondary | ICD-10-CM

## 2023-09-12 NOTE — Progress Notes (Signed)
 1610 Lead removal per Dr. Rhesa Celeste. Leads intact. Site clear. Wound care instructions given.

## 2023-09-12 NOTE — Progress Notes (Signed)
 PROVIDER NOTE: Interpretation of information contained herein should be left to medically-trained personnel. Specific patient instructions are provided elsewhere under "Patient Instructions" section of medical record. This document was created in part using AI and STT-dictation technology, any transcriptional errors that may result from this process are unintentional.  Patient: Emma Houston  Service: E/M Post-op Encounter  PCP: No primary care provider on file.  DOB: 12/23/1940  DOS: 09/12/2023  Provider: Edward Jolly, MD  MRN: [id number]  Delivery: Face-to-face  Specialty: Interventional Pain Management  Type: Established Patient  Setting: Ambulatory outpatient facility  Specialty designation: 09  Referring Prov.: No ref. provider found  Location: Outpatient office facility  SCS TRIAL POST-OP EVALUATION     Primary Reason(s) for Visit: Encounter for removal of temporary spinal cord stimulator lead(s) and evaluation of trial implant. CC: Back Pain (lower)  HPI  [name] is a 83 y.o. year old, female patient, who comes today for a post-procedure evaluation. She has Shortness of breath; Elevated troponin; Pain of both hip joints; Anxiety; At risk for delirium; At risk for falls; Benign essential hypertension; Depression; Diverticulitis; Chronic neck pain; GERD (gastroesophageal reflux disease); H/O TIA (transient ischemic attack) and stroke; Hyperlipidemia; Hyponatremia; Insomnia; Instability of right knee joint; Iron deficiency anemia; Arthritis; Osteoarthritis of right midfoot; Presence of right artificial knee joint; DDD (degenerative disc disease), lumbar; Stage 3a chronic kidney disease (HCC); TIA (transient ischemic attack); Aftercare following joint replacement; Memory changes; Rash; Lumbar post-laminectomy syndrome; Chronic radicular lumbar pain; Chronic pain syndrome; and Chronic knee pain after total replacement of right knee joint on their problem list. Her primarily concern today is the Back  Pain (lower)  Pain Assessment: Location: Lower Back Radiating: right leg to the knee Onset: More than a month ago Duration: Chronic pain Quality: Sharp Severity: 2 /10 (subjective, self-reported pain score)  Effect on ADL:   Timing: Intermittent Modifying factors: Tylenol, lying down BP: (!) 173/96  HR: 64  [name] comes in today, after a SCS (Spinal Cord Stimulator) Trial Implant on 09/06/2023, to have her percutaneous, temporary neurostimulator lead(s) removed and to evaluate the trial experience to determine if a permanent implant may be effective in controlling some or all of her chronic pain symptoms.  Further details on both, my assessment(s), as well as the proposed treatment plan, please see below.  Patient endorses approximately 55% pain relief with her spinal cord stimulator trial in place.  She states that she noticed improvement in her low back and radiating left leg pain and left hip pain.  She states that she would like to think about the permanent implant further and if she decides to move forward with it, she will let me know and also contact Dr. [facility].  Post-operative Assessment  Intra-procedural problems/complications: None observed.         Reported side-effects: None.        Post-surgical adverse reactions or complications: None reported         Laboratory Chemistry Profile   Renal Lab Results  Component Value Date   BUN 31 (H) 11/04/2019   CREATININE 1.46 (H) 11/04/2019   GFRAA 39 (L) 11/04/2019   GFRNONAA 34 (L) 11/04/2019   PROTEINUR NEGATIVE 11/04/2019     Electrolytes Lab Results  Component Value Date   NA 137 11/04/2019   K 3.7 11/04/2019   CL 101 11/04/2019   CALCIUM 9.2 11/04/2019   MG 2.1 02/26/2019     Hepatic Lab Results  Component Value Date   AST 25 02/25/2019  ALT 17 02/25/2019   ALBUMIN 4.5 02/25/2019   ALKPHOS 68 02/25/2019   LIPASE 29 02/25/2019     ID Lab Results  Component Value Date   SARSCOV2NAA  NEGATIVE 11/10/2019   STAPHAUREUS NEGATIVE 11/04/2019   MRSAPCR NEGATIVE 11/04/2019     Bone No results found for: "VD25OH", "VD125OH2TOT", "KV4259DG3", "OV5643PI9", "25OHVITD1", "25OHVITD2", "25OHVITD3", "TESTOFREE", "TESTOSTERONE"   Endocrine Lab Results  Component Value Date   GLUCOSE 98 11/04/2019   GLUCOSEU NEGATIVE 11/04/2019   HGBA1C 5.5 02/25/2019   TSH 1.847 02/25/2019     Neuropathy Lab Results  Component Value Date   HGBA1C 5.5 02/25/2019     CNS No results found for: "COLORCSF", "APPEARCSF", "RBCCOUNTCSF", "WBCCSF", "POLYSCSF", "LYMPHSCSF", "EOSCSF", "PROTEINCSF", "GLUCCSF", "JCVIRUS", "CSFOLI", "IGGCSF", "LABACHR", "ACETBL"   Inflammation (CRP: Acute  ESR: Chronic) No results found for: "CRP", "ESRSEDRATE", "LATICACIDVEN"   Rheumatology No results found for: "RF", "ANA", "LABURIC", "URICUR", "LYMEIGGIGMAB", "LYMEABIGMQN", "HLAB27"   Coagulation Lab Results  Component Value Date   INR 1.0 11/04/2019   LABPROT 13.1 11/04/2019   APTT 28 11/04/2019   PLT 248 11/04/2019     Cardiovascular Lab Results  Component Value Date   HGB 11.8 (L) 11/04/2019   HCT 36.2 11/04/2019     Screening Lab Results  Component Value Date   SARSCOV2NAA NEGATIVE 11/10/2019   STAPHAUREUS NEGATIVE 11/04/2019   MRSAPCR NEGATIVE 11/04/2019     Cancer No results found for: "CEA", "CA125", "LABCA2"   Allergens No results found for: "ALMOND", "APPLE", "ASPARAGUS", "AVOCADO", "BANANA", "BARLEY", "BASIL", "BAYLEAF", "GREENBEAN", "LIMABEAN", "WHITEBEAN", "BEEFIGE", "REDBEET", "BLUEBERRY", "BROCCOLI", "CABBAGE", "MELON", "CARROT", "CASEIN", "CASHEWNUT", "CAULIFLOWER", "CELERY"     Note: Lab results reviewed.  Recent Imaging Results  SCS trial leads removed under live fluoroscopy, tips intact  Meds   Current Outpatient Medications:    amLODipine (NORVASC) 2.5 MG tablet, Take 2.5 mg by mouth daily., Disp: , Rfl:    buPROPion (WELLBUTRIN XL) 300 MG 24 hr tablet, Take 300 mg by  mouth daily., Disp: , Rfl:    busPIRone (BUSPAR) 5 MG tablet, Take 1 tablet by mouth 2 (two) times daily., Disp: , Rfl:    carvedilol (COREG) 3.125 MG tablet, Take 1 tablet (3.125 mg total) by mouth 2 (two) times daily with a meal., Disp: 60 tablet, Rfl: 0   cephALEXin (KEFLEX) 500 MG capsule, Take 1 capsule (500 mg total) by mouth 4 (four) times daily for 7 days., Disp: 28 capsule, Rfl: 0   lidocaine (LIDODERM) 5 %, Place 1 patch onto the skin every 12 (twelve) hours. Remove & Discard patch within 12 hours or as directed by MD, Disp: 10 patch, Rfl: 0   Multiple Vitamins-Minerals (MULTIVITAMIN WITH MINERALS) tablet, Take 1 tablet by mouth daily., Disp: , Rfl:    nitroGLYCERIN (NITROSTAT) 0.4 MG SL tablet, Place 1 tablet (0.4 mg total) under the tongue every 5 (five) minutes as needed for chest pain., Disp: 20 tablet, Rfl: 0   rosuvastatin (CRESTOR) 10 MG tablet, Take 10 mg by mouth daily., Disp: , Rfl:    traZODone (DESYREL) 50 MG tablet, Take 50 mg by mouth at bedtime., Disp: , Rfl:    clobetasol cream (TEMOVATE) 0.05 %, Apply topically 2 (two) times daily. (Patient not taking: Reported on 07/12/2023), Disp: , Rfl:    hydrOXYzine (ATARAX) 50 MG tablet, Take by mouth. (Patient not taking: Reported on 07/12/2023), Disp: , Rfl:    triamcinolone ointment (KENALOG) 0.1 %, Apply topically. (Patient not taking: Reported on 07/12/2023), Disp: , Rfl:  Current Facility-Administered Medications:    triamcinolone acetonide (KENALOG-40) injection 40 mg, 40 mg, Other, Once, Hyatt, Max T, DPM  ROS  Constitutional: Denies any fever or chills Gastrointestinal: No reported hemesis, hematochezia, vomiting, or acute GI distress Musculoskeletal: Denies any acute onset joint swelling, redness, loss of ROM, or weakness Neurological: No reported episodes of acute onset apraxia, aphasia, dysarthria, agnosia, amnesia, paralysis, loss of coordination, or loss of consciousness  Allergies  Ms. Clanton has no known  allergies.  PFSH  Drug: Ms. Luddy  reports current drug use. Alcohol:  reports current alcohol use of about 1.0 standard drink of alcohol per week. Tobacco:  reports that she quit smoking about 43 years ago. Her smoking use included cigarettes. She has never used smokeless tobacco. Medical:  has a past medical history of Anemia, Arthritis, Chronic back pain, Chronic kidney disease, Coronary artery disease, Depression, GERD (gastroesophageal reflux disease), Hyperlipidemia, Hypertension, Insomnia, and Stroke (HCC). Surgical: Ms. Bram  has a past surgical history that includes Abdominal hysterectomy; Knee arthroscopy (Left); Joint replacement (Right); Joint replacement (Left); Fracture surgery (Bilateral); and Lumbar laminectomy/decompression microdiscectomy (N/A, 11/12/2019). Family: family history is not on file.  Postop Exam  General appearance: Afebrile. Well nourished, well developed, and well hydrated. In no apparent acute distress. Vitals:   09/12/23 0754  BP: (!) 173/96  Pulse: 64  Resp: 16  Temp: (!) 96.8 F (36 C)  TempSrc: Temporal  SpO2: 100%  Weight: 123 lb (55.8 kg)  Height: 5\' 4"  (1.626 m)   BMI Assessment: Estimated body mass index is 21.11 kg/m as calculated from the following:   Height as of this encounter: 5\' 4"  (1.626 m).   Weight as of this encounter: 123 lb (55.8 kg).  Surgical site: Wound is healing well. No redness, tenderness, discharge, abnormal odors, or any other evidence of infection or complications.  Assessment  Primary Diagnosis & Pertinent Problem List: The primary encounter diagnosis was Failed back surgical syndrome. Diagnoses of Lumbar post-laminectomy syndrome, Chronic radicular lumbar pain, and Chronic pain syndrome were also pertinent to this visit.  Diagnosis Status  1. Failed back surgical syndrome   2. Lumbar post-laminectomy syndrome   3. Chronic radicular lumbar pain   4. Chronic pain syndrome    Controlled Controlled Controlled    Plan of Care  Patient will notify either my clinic or Dr. Adriane Hora if she would like to move forward with her Medtronic spinal cord stimulator implant.   Orders:  Orders Placed This Encounter  Procedures   DG PAIN CLINIC C-ARM 1-60 MIN NO REPORT    Intraoperative interpretation by procedural physician at [facility].    Standing Status:   Standing    Number of Occurrences:   1    Reason for exam::   Assistance in needle guidance and placement for procedures requiring needle placement in or near specific anatomical locations not easily accessible without such assistance.     Medications administered: Labrea Hiemstra "[facility]" had no medications administered during this visit.  See the medical record for exact dosing, route, and time of administration.  Follow-up plan:   No follow-ups on file.      Recent Visits Date Type Provider Dept  09/05/23 Procedure visit Cephus Collin, MD [facility]  07/12/23 Office Visit Cephus Collin, MD [facility]  Showing recent visits within past 90 days and meeting all other requirements Today's Visits Date Type Provider Dept  09/12/23 Procedure visit Cephus Collin, MD [facility]  Showing today's visits and  meeting all other requirements Future Appointments No visits were found meeting these conditions. Showing future appointments within next 90 days and meeting all other requirements  Disposition: Discharge home  Discharge (Date  Time): 09/12/2023; 0835 hrs.   Primary Care Physician: No primary care provider on file. Location: [facility] Note by: Cephus Collin, MD (TTS technology used. I apologize for any typographical errors that were not detected and corrected.) Date: 09/12/2023; Time: 8:30 AM

## 2023-09-25 ENCOUNTER — Encounter: Payer: Self-pay | Admitting: Neurosurgery

## 2023-09-25 ENCOUNTER — Other Ambulatory Visit: Payer: Self-pay

## 2023-09-25 ENCOUNTER — Ambulatory Visit: Admitting: Neurosurgery

## 2023-09-25 VITALS — BP 134/68 | Ht 64.0 in | Wt 123.0 lb

## 2023-09-25 DIAGNOSIS — M961 Postlaminectomy syndrome, not elsewhere classified: Secondary | ICD-10-CM | POA: Diagnosis not present

## 2023-09-25 DIAGNOSIS — M4316 Spondylolisthesis, lumbar region: Secondary | ICD-10-CM

## 2023-09-25 DIAGNOSIS — M419 Scoliosis, unspecified: Secondary | ICD-10-CM

## 2023-09-25 NOTE — Addendum Note (Signed)
 Addended by: Cuma Polyakov on: 09/25/2023 04:25 PM   Modules accepted: Orders

## 2023-09-25 NOTE — Patient Instructions (Addendum)
 Please see below for information in regards to your upcoming surgery:   Planned surgery: thoracic laminectomy for spinal cord stimulator placement (Medtronic) Codes if you want to discuss with insurance: [id number], [phone], [id number], [phone], [id number]   Surgery date: 10/10/23 at [facility] (Medical Mall: [address], [place], [place] [address]) - you will find out your arrival time the business day before your surgery.   Pre-op appointment at [facility] Pre-admit Testing: you will receive a call with a date/time for this appointment. If you are scheduled for an in person appointment, Pre-admit Testing is located on the first floor of the [facility] building, [address], [address]. During this appointment, they will advise you which medications you can take the morning of surgery, and which medications you will need to hold for surgery. Labs (such as blood work, EKG) may be done at your pre-op appointment. You are not required to fast for these labs. Should you need to change your pre-op appointment, please call Pre-admit testing at [phone].     Surgical clearance: we will send a clearance form to Dr Pennelope Bowler. They may wish to see you in their office prior to signing the clearance form. If so, they may call you to schedule an appointment.    Common restrictions after surgery: No bending, lifting, or twisting ("BLT"). Avoid lifting objects heavier than 10 pounds for the first 6 weeks after surgery. Where possible, avoid household activities that involve lifting, bending, reaching, pushing, or pulling such as laundry, vacuuming, grocery shopping, and childcare. Try to arrange for help from friends and family for these activities while you heal. Do not drive while taking prescription pain medication. Weeks 6 through 12 after surgery: avoid lifting more than 25 pounds.    How to contact us :  If you have any questions/concerns before or after surgery, you can  reach us  at [phone], or you can send a mychart message. We can be reached by phone or mychart 8am-4pm, Monday-Friday.  *Please note: Calls after 4pm are forwarded to a third party answering service. Mychart messages are not routinely monitored during evenings, weekends, and holidays. Please call our office to contact the answering service for urgent concerns during non-business hours.    If you have FMLA/disability paperwork, please drop it off or fax it to [phone], attention Patty.   Appointments/FMLA & disability paperwork: Gerlean Kocher, & Maryann Smalls Registered Nurses/Surgery schedulers: Pixie Burgener & Lauren Medical Assistants: Donnajean Fuse Physician Assistants: Ludwig Safer, PA-C, Anastacio Karvonen, PA-C & Lucetta Russel, PA-C Surgeons: Jodeen Munch, MD & Henderson Lock, MD   [facility] PREADMIT TESTING VISIT and SURGERY INFORMATION SHEET   Now that surgery has been scheduled you can anticipate several phone calls from [facility] services. A pharmacy technician will call you to verify your current list of medications taken at home.               The Pre-Service Center will call to verify your insurance information and to give you billing estimates and information.             The Preadmit Testing Office will be calling to schedule a visit to obtain information for the anesthesia team and provide instructions on preparation for surgery.  What can you expect for the Preadmit Testing Visit: Appointments may be scheduled in-person or by telephone.  If a telephone visit is scheduled, you may be asked to come into the office to have lab tests or other studies  performed.   This visit will not be completed any greater than 14 days prior to your surgery.  If your surgery has been scheduled for a future date, please do not be alarmed if we have not contacted you to schedule an appointment more than a month prior to the surgery date.    Please be prepared to provide the  following information during this appointment:            -Personal medical history                                               -Medication and allergy list            -Any history of problems with anesthesia              -Recent lab work or diagnostic studies            -Please notify us  of any needs we should be aware of to provide the best care possible           -You will be provided with instructions on how to prepare for your surgery.    On The Day of Surgery:  You must have a driver to take you home after surgery, you will be asked not to drive for 24 hours following surgery.  Taxi, [employer] and non-medical transport will not be acceptable means of transportation unless you have a responsible individual who will be traveling with you.  Visitors in the surgical area:   2 people will be able to visit you in your room once your preparation for surgery has been completed. During surgery, your visitors will be asked to wait in the Surgery Waiting Area.  It is not a requirement for them to stay, if they prefer to leave and come back.  Your visitor(s) will be given an update once the surgery has been completed.  No visitors are allowed in the initial recovery room to respect patient privacy and safety.  Once you are more awake and transfer to the secondary recovery area, or are transferred to an inpatient room, visitors will again be able to see you.  To respect and protect your privacy: We will ask on the day of surgery who your driver will be and what the contact number for that individual will be. We will ask if it is okay to share information with this individual, or if there is an alternative individual that we, or the surgeon, should contact to provide updates and information. If family or friends come to the surgical information desk requesting information about you, who you have not listed with us , no information will be given.   It may be helpful to designate someone as the main contact  who will be responsible for updating your other friends and family.    PREADMIT TESTING OFFICE: [phone] SAME DAY SURGERY: [phone] We look forward to caring for you before and throughout the process of your surgery.

## 2023-09-25 NOTE — H&P (View-Only) (Signed)
 Referring Physician:  No referring provider defined for this encounter.  Primary Physician:  No primary care provider on file.  History of Present Illness: 09/25/2023 Emma Houston had an excellent response to stimulator evaluation.  She had 60% improvement in symptoms.  06/07/2023 Emma Houston is here today with a chief complaint of right knee pain and muscle loss.  Her right knee gives out at times.  Her pain can be as bad as 7 out of 10 and is worse in her back and on the lateral aspect of her right upper leg.  She has no pain or tingling below her knee.  She has no numbness.  She had her right knee replaced in 2021.  She has had knee pain since that time.  She underwent surgery in June 2021 with a lumbar decompression for left leg pain.  She did very well from that.  Walking and standing make her pain worse.  She also feels like her knee will give out at times.  Bowel/Bladder Dysfunction: none  Conservative measures:  Physical therapy: participated in at [facility] from 10/04/22 to 11/01/22 Multimodal medical therapy including regular antiinflammatories:  gabapentin Injections:  04/20/23: Left L5-S1 TF ESI (75% relief) 01/12/23: Left L5-S1 TF ESI (50% relief) 09/28/22: Left L5-S1 TF ESI (90% relief) 06/02/22: Left L5-S1 TF ESI (80% relief)   Past Surgery:  11/12/19: L3-L5 Decompression (by me)  [name] has no symptoms of cervical myelopathy.  The symptoms are causing a significant impact on the patient's life.   I have utilized the care everywhere function in epic to review the outside records available from external health systems.  Review of Systems:  A 10 point review of systems is negative, except for the pertinent positives and negatives detailed in the HPI.  Past Medical History: Past Medical History:  Diagnosis Date   Anemia    Arthritis    RA   Chronic back pain    Chronic kidney disease    frequent kidney infections   Coronary artery disease     Depression    GERD (gastroesophageal reflux disease)    Hyperlipidemia    Hypertension    Insomnia    Stroke (HCC)    TIA    Past Surgical History: Past Surgical History:  Procedure Laterality Date   ABDOMINAL HYSTERECTOMY     FRACTURE SURGERY Bilateral    plate on left   JOINT REPLACEMENT Right    knee   JOINT REPLACEMENT Left    KNEE ARTHROSCOPY Left    LUMBAR LAMINECTOMY/DECOMPRESSION MICRODISCECTOMY N/A 11/12/2019   Procedure: L3-5 DECOMPRESSION;  Surgeon: Jodeen Munch, MD;  Location: [facility] ORS;  Service: Neurosurgery;  Laterality: N/A;    Allergies: Allergies as of 09/25/2023   (No Known Allergies)    Medications:  Current Outpatient Medications:    amLODipine (NORVASC) 2.5 MG tablet, Take 2.5 mg by mouth daily., Disp: , Rfl:    buPROPion (WELLBUTRIN XL) 300 MG 24 hr tablet, Take 300 mg by mouth daily., Disp: , Rfl:    busPIRone (BUSPAR) 5 MG tablet, Take 1 tablet by mouth 2 (two) times daily., Disp: , Rfl:    carvedilol  (COREG ) 3.125 MG tablet, Take 1 tablet (3.125 mg total) by mouth 2 (two) times daily with a meal., Disp: 60 tablet, Rfl: 0   hydrOXYzine (ATARAX) 50 MG tablet, Take by mouth., Disp: , Rfl:    Multiple Vitamins-Minerals (MULTIVITAMIN WITH MINERALS) tablet, Take 1 tablet by mouth daily., Disp: , Rfl:  nitroGLYCERIN  (NITROSTAT ) 0.4 MG SL tablet, Place 1 tablet (0.4 mg total) under the tongue every 5 (five) minutes as needed for chest pain., Disp: 20 tablet, Rfl: 0   rosuvastatin (CRESTOR) 10 MG tablet, Take 10 mg by mouth daily., Disp: , Rfl:    traZODone  (DESYREL ) 50 MG tablet, Take 50 mg by mouth at bedtime., Disp: , Rfl:    lidocaine  (LIDODERM ) 5 %, Place 1 patch onto the skin every 12 (twelve) hours. Remove & Discard patch within 12 hours or as directed by MD (Patient not taking: Reported on 09/25/2023), Disp: 10 patch, Rfl: 0  Current Facility-Administered Medications:    triamcinolone  acetonide (KENALOG -40) injection 40 mg, 40 mg, Other,  Once, Hyatt, Max T, DPM  Social History: Social History   Tobacco Use   Smoking status: Former    Current packs/day: 0.00    Types: Cigarettes    Quit date: 1982    Years since quitting: 43.3   Smokeless tobacco: Never  Vaping Use   Vaping status: Never Used  Substance Use Topics   Alcohol use: Yes    Alcohol/week: 1.0 standard drink of alcohol    Types: 1 Cans of beer per week    Comment: occassional   Drug use: Yes    Comment: prescribed for chronic back pain    Family Medical History: History reviewed. No pertinent family history.  Physical Examination: Vitals:   09/25/23 1521  BP: 134/68    General: Patient is in no apparent distress. Attention to examination is appropriate.  Neck:   Supple.  Full range of motion.  Respiratory: Patient is breathing without any difficulty.   NEUROLOGICAL:     Awake, alert, oriented to person, place, and time.  Speech is clear and fluent.   Cranial Nerves: Pupils equal round and reactive to light.  Facial tone is symmetric.  Facial sensation is symmetric. Shoulder shrug is symmetric. Tongue protrusion is midline.  There is no pronator drift.  Strength: Side Biceps Triceps Deltoid Interossei Grip Wrist Ext. Wrist Flex.  R 5 5 5 5 5 5 5   L 5 5 5 5 5 5 5    Side Iliopsoas Quads Hamstring PF DF EHL  R 5 5 5 5 5 5   L 5 5 5 5 5 5    Reflexes are 2+ and symmetric at the biceps, triceps, brachioradialis, patella and achilles.   Hoffman's is absent.   Bilateral upper and lower extremity sensation is intact to light touch.    No evidence of dysmetria noted.  Gait is antalgic.  She has some tenderness around the medial aspect of her right knee.  She has some pain with range of motion of her right knee.  Her muscle bulk shows a loss of muscle in her right thigh.   Medical Decision Making  Imaging: MRI L spine 05/19/2023 IMPRESSION: 1. Overall unchanged lumbar spondylosis, with compression of the traversing left L3 nerve root  in the subarticular zone at L2-L3, displacement of the traversing right L5 nerve root in the subarticular zone at L4-L5, and compression of the traversing right S1 nerve root in the lateral recess at L5-S1. 2. Severe neural foraminal narrowing on the right at L3-L4 and L4-L5 and bilaterally at L5-S1.     Electronically Signed   By: Audra Blend M.D.   On: 06/01/2023 15:23  I have personally reviewed the images and agree with the above interpretation.  Assessment and Plan: Emma Houston is a pleasant 83 y.o. 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  She does have areas of compression.  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  She has postlaminectomy syndrome, and had a good response to stimulator trial. I recommended spinal cord stimulator placement.  I discussed the planned procedure at length with the patient, including the risks, benefits, alternatives, and indications. The risks discussed include but are not limited to bleeding, infection, need for reoperation, spinal fluid leak, stroke, vision loss, anesthetic complication, coma, paralysis, and even death. I also described in detail that improvement was not guaranteed.  The patient expressed understanding of these risks, and asked that we proceed with surgery. I described the surgery in layman's terms, and gave ample opportunity for questions, which were answered to the best of my ability.   I spent a total of 30 minutes in this patient's care today. This time was spent reviewing pertinent records including imaging studies, obtaining and confirming history, performing a directed evaluation, formulating and discussing my recommendations, and documenting the visit within the medical record.    Thank you for involving me in the care of this patient.      Joneric Streight K. Mont Antis MD, [facility]

## 2023-09-25 NOTE — Progress Notes (Signed)
 Referring Physician:  No referring provider defined for this encounter.  Primary Physician:  No primary care provider on file.  History of Present Illness: 09/25/2023 EmmaHouston had an excellent response to stimulator evaluation.  She had 60% improvement in symptoms.  06/07/2023 [name] is here today with a chief complaint of right knee pain and muscle loss.  Her right knee gives out at times.  Her pain can be as bad as 7 out of 10 and is worse in her back and on the lateral aspect of her right upper leg.  She has no pain or tingling below her knee.  She has no numbness.  She had her right knee replaced in 2021.  She has had knee pain since that time.  She underwent surgery in June 2021 with a lumbar decompression for left leg pain.  She did very well from that.  Walking and standing make her pain worse.  She also feels like her knee will give out at times.  Bowel/Bladder Dysfunction: none  Conservative measures:  Physical therapy: participated in at [facility] from 10/04/22 to 11/01/22 Multimodal medical therapy including regular antiinflammatories:  gabapentin Injections:  04/20/23: Left L5-S1 TF ESI (75% relief) 01/12/23: Left L5-S1 TF ESI (50% relief) 09/28/22: Left L5-S1 TF ESI (90% relief) 06/02/22: Left L5-S1 TF ESI (80% relief)   Past Surgery:  11/12/19: L3-L5 Decompression (by me)  Emma Houston has no symptoms of cervical myelopathy.  The symptoms are causing a significant impact on the patient's life.   I have utilized the care everywhere function in epic to review the outside records available from external health systems.  Review of Systems:  A 10 point review of systems is negative, except for the pertinent positives and negatives detailed in the HPI.  Past Medical History: Past Medical History:  Diagnosis Date   Anemia    Arthritis    RA   Chronic back pain    Chronic kidney disease    frequent kidney infections   Coronary artery disease     Depression    GERD (gastroesophageal reflux disease)    Hyperlipidemia    Hypertension    Insomnia    Stroke (HCC)    TIA    Past Surgical History: Past Surgical History:  Procedure Laterality Date   ABDOMINAL HYSTERECTOMY     FRACTURE SURGERY Bilateral    plate on left   JOINT REPLACEMENT Right    knee   JOINT REPLACEMENT Left    KNEE ARTHROSCOPY Left    LUMBAR LAMINECTOMY/DECOMPRESSION MICRODISCECTOMY N/A 11/12/2019   Procedure: L3-5 DECOMPRESSION;  Surgeon: Jodeen Munch, MD;  Location: [facility] ORS;  Service: Neurosurgery;  Laterality: N/A;    Allergies: Allergies as of 09/25/2023   (No Known Allergies)    Medications:  Current Outpatient Medications:    amLODipine (NORVASC) 2.5 MG tablet, Take 2.5 mg by mouth daily., Disp: , Rfl:    buPROPion (WELLBUTRIN XL) 300 MG 24 hr tablet, Take 300 mg by mouth daily., Disp: , Rfl:    busPIRone (BUSPAR) 5 MG tablet, Take 1 tablet by mouth 2 (two) times daily., Disp: , Rfl:    carvedilol  (COREG ) 3.125 MG tablet, Take 1 tablet (3.125 mg total) by mouth 2 (two) times daily with a meal., Disp: 60 tablet, Rfl: 0   hydrOXYzine (ATARAX) 50 MG tablet, Take by mouth., Disp: , Rfl:    Multiple Vitamins-Minerals (MULTIVITAMIN WITH MINERALS) tablet, Take 1 tablet by mouth daily., Disp: , Rfl:  nitroGLYCERIN  (NITROSTAT ) 0.4 MG SL tablet, Place 1 tablet (0.4 mg total) under the tongue every 5 (five) minutes as needed for chest pain., Disp: 20 tablet, Rfl: 0   rosuvastatin (CRESTOR) 10 MG tablet, Take 10 mg by mouth daily., Disp: , Rfl:    traZODone  (DESYREL ) 50 MG tablet, Take 50 mg by mouth at bedtime., Disp: , Rfl:    lidocaine  (LIDODERM ) 5 %, Place 1 patch onto the skin every 12 (twelve) hours. Remove & Discard patch within 12 hours or as directed by MD (Patient not taking: Reported on 09/25/2023), Disp: 10 patch, Rfl: 0  Current Facility-Administered Medications:    triamcinolone  acetonide (KENALOG -40) injection 40 mg, 40 mg, Other,  Once, Hyatt, Max T, DPM  Social History: Social History   Tobacco Use   Smoking status: Former    Current packs/day: 0.00    Types: Cigarettes    Quit date: 1982    Years since quitting: 43.3   Smokeless tobacco: Never  Vaping Use   Vaping status: Never Used  Substance Use Topics   Alcohol use: Yes    Alcohol/week: 1.0 standard drink of alcohol    Types: 1 Cans of beer per week    Comment: occassional   Drug use: Yes    Comment: prescribed for chronic back pain    Family Medical History: History reviewed. No pertinent family history.  Physical Examination: Vitals:   09/25/23 1521  BP: 134/68    General: Patient is in no apparent distress. Attention to examination is appropriate.  Neck:   Supple.  Full range of motion.  Respiratory: Patient is breathing without any difficulty.   NEUROLOGICAL:     Awake, alert, oriented to person, place, and time.  Speech is clear and fluent.   Cranial Nerves: Pupils equal round and reactive to light.  Facial tone is symmetric.  Facial sensation is symmetric. Shoulder shrug is symmetric. Tongue protrusion is midline.  There is no pronator drift.  Strength: Side Biceps Triceps Deltoid Interossei Grip Wrist Ext. Wrist Flex.  R 5 5 5 5 5 5 5   L 5 5 5 5 5 5 5    Side Iliopsoas Quads Hamstring PF DF EHL  R 5 5 5 5 5 5   L 5 5 5 5 5 5    Reflexes are 2+ and symmetric at the biceps, triceps, brachioradialis, patella and achilles.   Hoffman's is absent.   Bilateral upper and lower extremity sensation is intact to light touch.    No evidence of dysmetria noted.  Gait is antalgic.  She has some tenderness around the medial aspect of her right knee.  She has some pain with range of motion of her right knee.  Her muscle bulk shows a loss of muscle in her right thigh.   Medical Decision Making  Imaging: MRI L spine 05/19/2023 IMPRESSION: 1. Overall unchanged lumbar spondylosis, with compression of the traversing left L3 nerve root  in the subarticular zone at L2-L3, displacement of the traversing right L5 nerve root in the subarticular zone at L4-L5, and compression of the traversing right S1 nerve root in the lateral recess at L5-S1. 2. Severe neural foraminal narrowing on the right at L3-L4 and L4-L5 and bilaterally at L5-S1.     Electronically Signed   By: Audra Blend M.D.   On: 06/01/2023 15:23  I have personally reviewed the images and agree with the above interpretation.  Assessment and Plan: Ms. Benz is a pleasant 83 y.o. 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  She does have areas of compression.  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  She has postlaminectomy syndrome, and had a good response to stimulator trial. I recommended spinal cord stimulator placement.  I discussed the planned procedure at length with the patient, including the risks, benefits, alternatives, and indications. The risks discussed include but are not limited to bleeding, infection, need for reoperation, spinal fluid leak, stroke, vision loss, anesthetic complication, coma, paralysis, and even death. I also described in detail that improvement was not guaranteed.  The patient expressed understanding of these risks, and asked that we proceed with surgery. I described the surgery in layman's terms, and gave ample opportunity for questions, which were answered to the best of my ability.   I spent a total of 30 minutes in this patient's care today. This time was spent reviewing pertinent records including imaging studies, obtaining and confirming history, performing a directed evaluation, formulating and discussing my recommendations, and documenting the visit within the medical record.    Thank you for involving me in the care of this patient.      Joneric Streight K. Mont Antis MD, [facility]

## 2023-10-04 ENCOUNTER — Encounter
Admission: RE | Admit: 2023-10-04 | Discharge: 2023-10-04 | Disposition: A | Discharge status: Home / Self Care | Source: Ambulatory Visit | Attending: Neurosurgery | Admitting: Neurosurgery

## 2023-10-04 ENCOUNTER — Other Ambulatory Visit: Payer: Self-pay

## 2023-10-04 DIAGNOSIS — D509 Iron deficiency anemia, unspecified: Secondary | ICD-10-CM | POA: Diagnosis not present

## 2023-10-04 DIAGNOSIS — Z01812 Encounter for preprocedural laboratory examination: Secondary | ICD-10-CM

## 2023-10-04 DIAGNOSIS — I10 Essential (primary) hypertension: Secondary | ICD-10-CM | POA: Diagnosis not present

## 2023-10-04 DIAGNOSIS — Z01818 Encounter for other preprocedural examination: Secondary | ICD-10-CM | POA: Insufficient documentation

## 2023-10-04 DIAGNOSIS — Z01810 Encounter for preprocedural cardiovascular examination: Secondary | ICD-10-CM

## 2023-10-04 DIAGNOSIS — G459 Transient cerebral ischemic attack, unspecified: Secondary | ICD-10-CM | POA: Insufficient documentation

## 2023-10-04 HISTORY — DX: Diverticulitis of intestine, part unspecified, without perforation or abscess without bleeding: K57.92

## 2023-10-04 HISTORY — DX: Chronic pain syndrome: G89.4

## 2023-10-04 HISTORY — DX: Personal history of transient ischemic attack (TIA), and cerebral infarction without residual deficits: Z86.73

## 2023-10-04 HISTORY — DX: Iron deficiency anemia, unspecified: D50.9

## 2023-10-04 HISTORY — DX: Postlaminectomy syndrome, not elsewhere classified: M96.1

## 2023-10-04 HISTORY — DX: Transient cerebral ischemic attack, unspecified: G45.9

## 2023-10-04 LAB — CBC
HCT: 33.5 % — ABNORMAL LOW (ref 36.0–46.0)
Hemoglobin: 10.6 g/dL — ABNORMAL LOW (ref 12.0–15.0)
MCH: 30.1 pg (ref 26.0–34.0)
MCHC: 31.6 g/dL (ref 30.0–36.0)
MCV: 95.2 fL (ref 80.0–100.0)
Platelets: 244 10*3/uL (ref 150–400)
RBC: 3.52 MIL/uL — ABNORMAL LOW (ref 3.87–5.11)
RDW: 13.9 % (ref 11.5–15.5)
WBC: 5.5 10*3/uL (ref 4.0–10.5)
nRBC: 0 % (ref 0.0–0.2)

## 2023-10-04 LAB — SURGICAL PCR SCREEN
MRSA, PCR: NEGATIVE
Staphylococcus aureus: NEGATIVE

## 2023-10-04 LAB — TYPE AND SCREEN
ABO/RH(D): AB NEG
Antibody Screen: NEGATIVE

## 2023-10-04 NOTE — Patient Instructions (Addendum)
 Your procedure is scheduled on:10-10-23 Wednesday Report to the Registration Desk on the 1st floor of the [facility].Then proceed to the 2nd floor Surgery Desk To find out your arrival time, please call [phone] between 1PM - 3PM on:10-09-23 Tuesday If your arrival time is 6:00 am, do not arrive before that time as the [facility] entrance doors do not open until 6:00 am.  REMEMBER: Instructions that are not followed completely may result in serious medical risk, up to and including death; or upon the discretion of your surgeon and anesthesiologist your surgery may need to be rescheduled.  Do not eat food after midnight the night before surgery.  No gum chewing or hard candies.  You may however, drink CLEAR liquids up to 2 hours before you are scheduled to arrive for your surgery. Do not drink anything within 2 hours of your scheduled arrival time.  Clear liquids include: - water  - apple juice without pulp - gatorade (not RED colors) - black coffee or tea (Do NOT add milk or creamers to the coffee or tea) Do NOT drink anything that is not on this list.  One week prior to surgery:Stop NOW (10-04-23) Stop ANY OVER THE COUNTER supplements until after surgery (Multivitamin)  Continue taking all of your other prescription medications up until the day of surgery.  ON THE DAY OF SURGERY ONLY TAKE THESE MEDICATIONS WITH SIPS OF WATER: -amLODipine (NORVASC)  -buPROPion (WELLBUTRIN XL)  -busPIRone (BUSPAR)  -carvedilol  (COREG )  -rosuvastatin (CRESTOR)   No Alcohol for 24 hours before or after surgery.  No Smoking including e-cigarettes for 24 hours before surgery.  No chewable tobacco products for at least 6 hours before surgery.  No nicotine patches on the day of surgery.  Do not use any "recreational" drugs for at least a week (preferably 2 weeks) before your surgery.  Please be advised that the combination of cocaine and anesthesia may have negative outcomes, up to and including  death. If you test positive for cocaine, your surgery will be cancelled.  On the morning of surgery brush your teeth with toothpaste and water, you may rinse your mouth with mouthwash if you wish. Do not swallow any toothpaste or mouthwash.  Use CHG Soap as directed on instruction sheet.  Do not wear jewelry, make-up, hairpins, clips or nail polish.  For welded (permanent) jewelry: bracelets, anklets, waist bands, etc.  Please have this removed prior to surgery.  If it is not removed, there is a chance that hospital personnel will need to cut it off on the day of surgery.  Do not wear lotions, powders, or perfumes.   Do not shave body hair from the neck down 48 hours before surgery.  Contact lenses, hearing aids and dentures may not be worn into surgery.  Do not bring valuables to the hospital. [facility] is not responsible for any missing/lost belongings or valuables.   Notify your doctor if there is any change in your medical condition (cold, fever, infection).  Wear comfortable clothing (specific to your surgery type) to the hospital.  After surgery, you can help prevent lung complications by doing breathing exercises.  Take deep breaths and cough every 1-2 hours. Your doctor may order a device called an Incentive Spirometer to help you take deep breaths. When coughing or sneezing, hold a pillow firmly against your incision with both hands. This is called "splinting." Doing this helps protect your incision. It also decreases belly discomfort.  If you are being admitted to the  hospital overnight, leave your suitcase in the car. After surgery it may be brought to your room.  In case of increased patient census, it may be necessary for you, the patient, to continue your postoperative care in the Same Day Surgery department.  If you are being discharged the day of surgery, you will not be allowed to drive home. You will need a responsible individual to drive you home and stay with  you for 24 hours after surgery.   If you are taking public transportation, you will need to have a responsible individual with you.  Please call the Pre-admissions Testing Dept. at [phone] if you have any questions about these instructions.  Surgery Visitation Policy:  Patients having surgery or a procedure may have two visitors.  Children under the age of 50 must have an adult with them who is not the patient.    Pre-operative 5 CHG Bath Instructions   You can play a key role in reducing the risk of infection after surgery. Your skin needs to be as free of germs as possible. You can reduce the number of germs on your skin by washing with CHG (chlorhexidine  gluconate) soap before surgery. CHG is an antiseptic soap that kills germs and continues to kill germs even after washing.   DO NOT use if you have an allergy to chlorhexidine /CHG or antibacterial soaps. If your skin becomes reddened or irritated, stop using the CHG and notify one of our RNs at [phone].   Please shower with the CHG soap starting 4 days before surgery using the following schedule:     Please keep in mind the following:  DO NOT shave, including legs and underarms, starting the day of your first shower.   You may shave your face at any point before/day of surgery.  Place clean sheets on your bed the day you start using CHG soap. Use a clean washcloth (not used since being washed) for each shower. DO NOT sleep with pets once you start using the CHG.   CHG Shower Instructions:  If you choose to wash your hair and private area, wash first with your normal shampoo/soap.  After you use shampoo/soap, rinse your hair and body thoroughly to remove shampoo/soap residue.  Turn the water OFF and apply about 3 tablespoons (45 ml) of CHG soap to a CLEAN washcloth.  Apply CHG soap ONLY FROM YOUR NECK DOWN TO YOUR TOES (washing for 3-5 minutes)  DO NOT use CHG soap on face, private areas, open wounds, or sores.  Pay  special attention to the area where your surgery is being performed.  If you are having back surgery, having someone wash your back for you may be helpful. Wait 2 minutes after CHG soap is applied, then you may rinse off the CHG soap.  Pat dry with a clean towel  Put on clean clothes/pajamas   If you choose to wear lotion, please use ONLY the CHG-compatible lotions on the back of this paper.     Additional instructions for the day of surgery: DO NOT APPLY any lotions, deodorants, cologne, or perfumes.   Put on clean/comfortable clothes.  Brush your teeth.  Ask your nurse before applying any prescription medications to the skin.      CHG Compatible Lotions   Aveeno Moisturizing lotion  Cetaphil Moisturizing Cream  Cetaphil Moisturizing Lotion  Clairol Herbal Essence Moisturizing Lotion, Dry Skin  Clairol Herbal Essence Moisturizing Lotion, Extra Dry Skin  Clairol Herbal Essence Moisturizing Lotion, Normal Skin  Curel Age Defying Therapeutic Moisturizing Lotion with Alpha Hydroxy  Curel Extreme Care Body Lotion  Curel Soothing Hands Moisturizing Hand Lotion  Curel Therapeutic Moisturizing Cream, Fragrance-Free  Curel Therapeutic Moisturizing Lotion, Fragrance-Free  Curel Therapeutic Moisturizing Lotion, Original Formula  Eucerin Daily Replenishing Lotion  Eucerin Dry Skin Therapy Plus Alpha Hydroxy Crme  Eucerin Dry Skin Therapy Plus Alpha Hydroxy Lotion  Eucerin Original Crme  Eucerin Original Lotion  Eucerin Plus Crme Eucerin Plus Lotion  Eucerin TriLipid Replenishing Lotion  Keri Anti-Bacterial Hand Lotion  Keri Deep Conditioning Original Lotion Dry Skin Formula Softly Scented  Keri Deep Conditioning Original Lotion, Fragrance Free Sensitive Skin Formula  Keri Lotion Fast Absorbing Fragrance Free Sensitive Skin Formula  Keri Lotion Fast Absorbing Softly Scented Dry Skin Formula  Keri Original Lotion  Keri Skin Renewal Lotion Keri Silky Smooth Lotion  Keri Silky Smooth  Sensitive Skin Lotion  Nivea Body Creamy Conditioning Oil  Nivea Body Extra Enriched Teacher, adult education Moisturizing Lotion Nivea Crme  Nivea Skin Firming Lotion  NutraDerm 30 Skin Lotion  NutraDerm Skin Lotion  NutraDerm Therapeutic Skin Cream  NutraDerm Therapeutic Skin Lotion  ProShield Protective Hand Cream  Provon moisturizing lotion

## 2023-10-08 ENCOUNTER — Telehealth: Payer: Self-pay

## 2023-10-08 NOTE — Telephone Encounter (Signed)
 I spoke with Emma Houston's daughter to inform her that the surgery authorization is still pending and because of this, Dr Mont Antis has asked that we postpone the surgery. Surgery has been rescheduled to 10/17/23. I have notified the OR, PAT, reps, and monitoring.

## 2023-10-10 DIAGNOSIS — D509 Iron deficiency anemia, unspecified: Secondary | ICD-10-CM

## 2023-10-10 DIAGNOSIS — I10 Essential (primary) hypertension: Secondary | ICD-10-CM

## 2023-10-10 DIAGNOSIS — G459 Transient cerebral ischemic attack, unspecified: Secondary | ICD-10-CM

## 2023-10-10 DIAGNOSIS — Z01812 Encounter for preprocedural laboratory examination: Secondary | ICD-10-CM

## 2023-10-10 DIAGNOSIS — Z01810 Encounter for preprocedural cardiovascular examination: Secondary | ICD-10-CM

## 2023-10-10 DIAGNOSIS — Z01818 Encounter for other preprocedural examination: Secondary | ICD-10-CM

## 2023-10-16 MED ORDER — CEFAZOLIN SODIUM-DEXTROSE 2-4 GM/100ML-% IV SOLN
2.0000 g | INTRAVENOUS | Status: AC
  Administered 2023-10-17: 2 g via INTRAVENOUS

## 2023-10-16 MED ORDER — VANCOMYCIN HCL IN DEXTROSE 1-5 GM/200ML-% IV SOLN: 1000.0000 mg | Freq: Once | INTRAVENOUS | Status: DC

## 2023-10-16 MED ORDER — CEFAZOLIN IN SODIUM CHLORIDE 2-0.9 GM/100ML-% IV SOLN
2.0000 g | Freq: Once | INTRAVENOUS | Status: DC
  Filled 2023-10-16: qty 100

## 2023-10-16 MED ORDER — LACTATED RINGERS IV SOLN: INTRAVENOUS | Status: DC

## 2023-10-16 MED ORDER — CHLORHEXIDINE GLUCONATE 0.12 % MT SOLN: 15.0000 mL | Freq: Once | OROMUCOSAL | Status: DC

## 2023-10-16 MED ORDER — ORAL CARE MOUTH RINSE: 15.0000 mL | Freq: Once | OROMUCOSAL | Status: DC

## 2023-10-17 ENCOUNTER — Other Ambulatory Visit: Payer: Self-pay

## 2023-10-17 ENCOUNTER — Ambulatory Visit: Payer: Self-pay | Admitting: Urgent Care

## 2023-10-17 ENCOUNTER — Ambulatory Visit

## 2023-10-17 ENCOUNTER — Ambulatory Visit: Admitting: Anesthesiology

## 2023-10-17 ENCOUNTER — Encounter
Admission: RE | Disposition: A | Discharge status: Home / Self Care | Payer: Self-pay | Source: Ambulatory Visit | Attending: Neurosurgery

## 2023-10-17 ENCOUNTER — Ambulatory Visit
Admission: RE | Admit: 2023-10-17 | Discharge: 2023-10-17 | Disposition: A | Discharge status: Home / Self Care | Source: Ambulatory Visit | Attending: Neurosurgery | Admitting: Neurosurgery

## 2023-10-17 ENCOUNTER — Encounter: Payer: Self-pay | Admitting: Neurosurgery

## 2023-10-17 DIAGNOSIS — N189 Chronic kidney disease, unspecified: Secondary | ICD-10-CM | POA: Insufficient documentation

## 2023-10-17 DIAGNOSIS — Z01810 Encounter for preprocedural cardiovascular examination: Secondary | ICD-10-CM

## 2023-10-17 DIAGNOSIS — Z01812 Encounter for preprocedural laboratory examination: Secondary | ICD-10-CM

## 2023-10-17 DIAGNOSIS — G459 Transient cerebral ischemic attack, unspecified: Secondary | ICD-10-CM

## 2023-10-17 DIAGNOSIS — I129 Hypertensive chronic kidney disease with stage 1 through stage 4 chronic kidney disease, or unspecified chronic kidney disease: Secondary | ICD-10-CM | POA: Insufficient documentation

## 2023-10-17 DIAGNOSIS — G894 Chronic pain syndrome: Secondary | ICD-10-CM | POA: Insufficient documentation

## 2023-10-17 DIAGNOSIS — I2510 Atherosclerotic heart disease of native coronary artery without angina pectoris: Secondary | ICD-10-CM | POA: Insufficient documentation

## 2023-10-17 DIAGNOSIS — Z87891 Personal history of nicotine dependence: Secondary | ICD-10-CM | POA: Insufficient documentation

## 2023-10-17 DIAGNOSIS — Z01818 Encounter for other preprocedural examination: Secondary | ICD-10-CM

## 2023-10-17 DIAGNOSIS — I10 Essential (primary) hypertension: Secondary | ICD-10-CM

## 2023-10-17 DIAGNOSIS — D509 Iron deficiency anemia, unspecified: Secondary | ICD-10-CM

## 2023-10-17 DIAGNOSIS — M4316 Spondylolisthesis, lumbar region: Secondary | ICD-10-CM | POA: Insufficient documentation

## 2023-10-17 DIAGNOSIS — M961 Postlaminectomy syndrome, not elsewhere classified: Secondary | ICD-10-CM

## 2023-10-17 DIAGNOSIS — M419 Scoliosis, unspecified: Secondary | ICD-10-CM | POA: Diagnosis not present

## 2023-10-17 HISTORY — PX: THORACIC LAMINECTOMY FOR SPINAL CORD STIMULATOR: SHX6887

## 2023-10-17 SURGERY — THORACIC LAMINECTOMY FOR SPINAL CORD STIMULATOR
Anesthesia: General

## 2023-10-17 MED ORDER — VANCOMYCIN HCL IN DEXTROSE 1-5 GM/200ML-% IV SOLN
INTRAVENOUS | Status: AC
  Filled 2023-10-17: qty 200

## 2023-10-17 MED ORDER — BUPIVACAINE-EPINEPHRINE (PF) 0.5% -1:200000 IJ SOLN
INTRAMUSCULAR | Status: DC | PRN
  Administered 2023-10-17: 10 mL

## 2023-10-17 MED ORDER — SODIUM CHLORIDE (PF) 0.9 % IJ SOLN
INTRAMUSCULAR | Status: DC | PRN
  Administered 2023-10-17: 60 mL

## 2023-10-17 MED ORDER — PROPOFOL 10 MG/ML IV BOLUS
INTRAVENOUS | Status: DC | PRN
  Administered 2023-10-17: 100 mg via INTRAVENOUS
  Administered 2023-10-17: 50 ug/kg/min via INTRAVENOUS

## 2023-10-17 MED ORDER — SODIUM CHLORIDE (PF) 0.9 % IJ SOLN
INTRAMUSCULAR | Status: AC
  Filled 2023-10-17: qty 20

## 2023-10-17 MED ORDER — GLYCOPYRROLATE 0.2 MG/ML IJ SOLN
INTRAMUSCULAR | Status: DC | PRN
  Administered 2023-10-17: .2 mg via INTRAVENOUS

## 2023-10-17 MED ORDER — PROPOFOL 10 MG/ML IV BOLUS
INTRAVENOUS | Status: AC
  Filled 2023-10-17: qty 20

## 2023-10-17 MED ORDER — KETAMINE HCL 50 MG/5ML IJ SOSY
PREFILLED_SYRINGE | INTRAMUSCULAR | Status: AC
  Filled 2023-10-17: qty 5

## 2023-10-17 MED ORDER — 0.9 % SODIUM CHLORIDE (POUR BTL) OPTIME
TOPICAL | Status: DC | PRN
  Administered 2023-10-17: 500 mL

## 2023-10-17 MED ORDER — SENNA 8.6 MG PO TABS
1.0000 | ORAL_TABLET | Freq: Two times a day (BID) | ORAL | 0 refills | Status: DC | PRN
  Filled 2023-10-17: qty 30, 15d supply, fill #0

## 2023-10-17 MED ORDER — ONDANSETRON HCL 4 MG/2ML IJ SOLN
INTRAMUSCULAR | Status: DC | PRN
  Administered 2023-10-17: 4 mg via INTRAVENOUS

## 2023-10-17 MED ORDER — OXYCODONE HCL 5 MG/5ML PO SOLN: 5.0000 mg | Freq: Once | ORAL | Status: AC | PRN

## 2023-10-17 MED ORDER — METHOCARBAMOL 500 MG PO TABS
500.0000 mg | ORAL_TABLET | Freq: Four times a day (QID) | ORAL | 0 refills | Status: DC | PRN
  Filled 2023-10-17: qty 120, 30d supply, fill #0

## 2023-10-17 MED ORDER — REMIFENTANIL HCL 1 MG IV SOLR
INTRAVENOUS | Status: AC
  Filled 2023-10-17: qty 1000

## 2023-10-17 MED ORDER — LIDOCAINE HCL (CARDIAC) PF 100 MG/5ML IV SOSY
PREFILLED_SYRINGE | INTRAVENOUS | Status: DC | PRN
  Administered 2023-10-17: 60 mg via INTRAVENOUS

## 2023-10-17 MED ORDER — CHLORHEXIDINE GLUCONATE 0.12 % MT SOLN
OROMUCOSAL | Status: AC
  Filled 2023-10-17: qty 15

## 2023-10-17 MED ORDER — OXYCODONE HCL 5 MG PO TABS
5.0000 mg | ORAL_TABLET | Freq: Once | ORAL | Status: AC | PRN
  Administered 2023-10-17: 5 mg via ORAL

## 2023-10-17 MED ORDER — OXYCODONE HCL 5 MG PO TABS
5.0000 mg | ORAL_TABLET | ORAL | 0 refills | Status: AC | PRN
  Filled 2023-10-17: qty 20, 4d supply, fill #0

## 2023-10-17 MED ORDER — FENTANYL CITRATE (PF) 100 MCG/2ML IJ SOLN
INTRAMUSCULAR | Status: DC | PRN
  Administered 2023-10-17: 100 ug via INTRAVENOUS

## 2023-10-17 MED ORDER — FENTANYL CITRATE (PF) 100 MCG/2ML IJ SOLN
25.0000 ug | INTRAMUSCULAR | Status: DC | PRN
  Administered 2023-10-17: 25 ug via INTRAVENOUS
  Administered 2023-10-17: 50 ug via INTRAVENOUS

## 2023-10-17 MED ORDER — FENTANYL CITRATE (PF) 100 MCG/2ML IJ SOLN
INTRAMUSCULAR | Status: AC
  Filled 2023-10-17: qty 2

## 2023-10-17 MED ORDER — CEFAZOLIN SODIUM-DEXTROSE 2-4 GM/100ML-% IV SOLN
INTRAVENOUS | Status: AC
  Filled 2023-10-17: qty 100

## 2023-10-17 MED ORDER — REMIFENTANIL HCL 1 MG IV SOLR
INTRAVENOUS | Status: DC | PRN
  Administered 2023-10-17: .1 ug/kg/min via INTRAVENOUS

## 2023-10-17 MED ORDER — BUPIVACAINE HCL (PF) 0.5 % IJ SOLN
INTRAMUSCULAR | Status: AC
  Filled 2023-10-17: qty 30

## 2023-10-17 MED ORDER — BUPIVACAINE LIPOSOME 1.3 % IJ SUSP
INTRAMUSCULAR | Status: AC
  Filled 2023-10-17: qty 20

## 2023-10-17 MED ORDER — SUCCINYLCHOLINE CHLORIDE 200 MG/10ML IV SOSY
PREFILLED_SYRINGE | INTRAVENOUS | Status: DC | PRN
  Administered 2023-10-17: 60 mg via INTRAVENOUS

## 2023-10-17 MED ORDER — IRRISEPT - 450ML BOTTLE WITH 0.05% CHG IN STERILE WATER, USP 99.95% OPTIME
TOPICAL | Status: DC | PRN
  Administered 2023-10-17: 450 mL via TOPICAL

## 2023-10-17 MED ORDER — OXYCODONE HCL 5 MG PO TABS
ORAL_TABLET | ORAL | Status: AC
  Filled 2023-10-17: qty 1

## 2023-10-17 MED ORDER — DEXAMETHASONE SODIUM PHOSPHATE 10 MG/ML IJ SOLN
INTRAMUSCULAR | Status: DC | PRN
  Administered 2023-10-17: 5 mg via INTRAVENOUS

## 2023-10-17 MED ORDER — SURGIFLO WITH THROMBIN (HEMOSTATIC MATRIX KIT) OPTIME
TOPICAL | Status: DC | PRN
  Administered 2023-10-17: 1 via TOPICAL

## 2023-10-17 MED ORDER — VANCOMYCIN HCL 1000 MG IV SOLR
INTRAVENOUS | Status: DC | PRN
  Administered 2023-10-17: 1000 mg via INTRAVENOUS

## 2023-10-17 MED ORDER — ACETAMINOPHEN 10 MG/ML IV SOLN
INTRAVENOUS | Status: DC | PRN
  Administered 2023-10-17: 1000 mg via INTRAVENOUS

## 2023-10-17 MED ORDER — PROPOFOL 1000 MG/100ML IV EMUL
INTRAVENOUS | Status: AC
  Filled 2023-10-17: qty 100

## 2023-10-17 SURGICAL SUPPLY — 38 items
BUR NEURO DRILL SOFT 3.0X3.8M (BURR) ×1 IMPLANT
CONTROLLER HANDSET COMM KIT (NEUROSURGERY SUPPLIES) IMPLANT
DERMABOND ADVANCED .7 DNX12 (GAUZE/BANDAGES/DRESSINGS) ×2 IMPLANT
DRAPE C ARM PK CFD 31 SPINE (DRAPES) ×1 IMPLANT
DRAPE LAPAROTOMY 100X77 ABD (DRAPES) ×1 IMPLANT
DRSG OPSITE POSTOP 4X6 (GAUZE/BANDAGES/DRESSINGS) IMPLANT
DRSG OPSITE POSTOP 4X8 (GAUZE/BANDAGES/DRESSINGS) IMPLANT
ELECTRODE REM PT RTRN 9FT ADLT (ELECTROSURGICAL) ×1 IMPLANT
FEE INTRAOP CADWELL SUPPLY NCS (MISCELLANEOUS) ×1 IMPLANT
FEE INTRAOP MONITOR IMPULS NCS (MISCELLANEOUS) ×1 IMPLANT
GLOVE BIOGEL PI IND STRL 6.5 (GLOVE) ×1 IMPLANT
GLOVE SURG SYN 6.5 ES PF (GLOVE) ×1 IMPLANT
GLOVE SURG SYN 6.5 PF PI (GLOVE) ×1 IMPLANT
GLOVE SURG SYN 8.5 E (GLOVE) ×3 IMPLANT
GLOVE SURG SYN 8.5 PF PI (GLOVE) ×3 IMPLANT
GOWN SRG LRG LVL 4 IMPRV REINF (GOWNS) ×1 IMPLANT
GOWN SRG XL LVL 3 NONREINFORCE (GOWNS) ×1 IMPLANT
KIT SPINAL PRONEVIEW (KITS) ×1 IMPLANT
LAVAGE JET IRRISEPT WOUND (IRRIGATION / IRRIGATOR) ×1 IMPLANT
MANIFOLD NEPTUNE II (INSTRUMENTS) ×1 IMPLANT
MARKER SKIN DUAL TIP RULER LAB (MISCELLANEOUS) ×1 IMPLANT
NDL SAFETY ECLIPSE 18X1.5 (NEEDLE) ×1 IMPLANT
NEUROSTIM BELT FIXATION MED (NEUROSURGERY SUPPLIES) IMPLANT
NEUROSTIM INCEPTIV (Neuro Prosthesis/Implant) IMPLANT
NS IRRIG 500ML POUR BTL (IV SOLUTION) ×1 IMPLANT
PACK LAMINECTOMY ARMC (PACKS) ×1 IMPLANT
PROGRAMMER AND COMM CASE (NEUROSURGERY SUPPLIES) IMPLANT
RECHARGER SYSTEM (NEUROSURGERY SUPPLIES) IMPLANT
STAPLER SKIN PROX 35W (STAPLE) ×1 IMPLANT
STIMULATOR CORD SURESCAN MRI (Stimulator) IMPLANT
SURGIFLO W/THROMBIN 8M KIT (HEMOSTASIS) ×1 IMPLANT
SUT SILK 2 0SH CR/8 30 (SUTURE) ×1 IMPLANT
SUT STRATA 3-0 15 PS-2 (SUTURE) ×2 IMPLANT
SUT VIC AB 0 CT1 18XCR BRD 8 (SUTURE) ×1 IMPLANT
SUT VIC AB 2-0 CT1 18 (SUTURE) ×1 IMPLANT
SYR 10ML LL (SYRINGE) IMPLANT
SYR 30ML LL (SYRINGE) ×2 IMPLANT
TRAP FLUID SMOKE EVACUATOR (MISCELLANEOUS) ×1 IMPLANT

## 2023-10-17 NOTE — Discharge Summary (Signed)
 Discharge Summary  Patient ID: Emma Houston MRN: [id number] DOB/AGE: [id number] 82 y.o.  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 Her intraoperative course was uncomplicated. She was monitored in PACU and discharged home after ambulating, urinating, and tolerating PO intake. She was given prescriptions for Oxycodone , Robaxin , and Senna to take as needed.   Consults: None  Significant Diagnostic Studies: NA  Treatments: surgery: as above. Please see separately dictated operative report for further details   Discharge Exam: Blood pressure (!) 162/78, pulse (!) 56, temperature 97.9 F (36.6 C), resp. rate 15, SpO2 97%. CN grossly intact MAEW Incisions c/d/i  Disposition: Discharge disposition: 01-Home or Self Care        Allergies as of 10/17/2023   No Known Allergies      Medication List     TAKE these medications    amLODipine 2.5 MG tablet Commonly known as: NORVASC Take 2.5 mg by mouth every morning.   buPROPion 300 MG 24 hr tablet Commonly known as: WELLBUTRIN XL Take 300 mg by mouth every morning.   busPIRone 5 MG tablet Commonly known as: BUSPAR Take 1 tablet by mouth 2 (two) times daily.   carvedilol  3.125 MG tablet Commonly known as: COREG  Take 1 tablet (3.125 mg total) by mouth 2 (two) times daily with a meal.   hydrOXYzine 50 MG tablet Commonly known as: ATARAX Take 50 mg by mouth every 6 (six) hours as needed.   lidocaine  5 % Commonly known as: Lidoderm  Place 1 patch onto the skin every 12 (twelve) hours. Remove & Discard patch within 12 hours or as directed by MD What changed:  when to take this reasons to take  this   methocarbamol  500 MG tablet Commonly known as: ROBAXIN  Take 1 tablet (500 mg total) by mouth every 6 (six) hours as needed.   multivitamin with minerals tablet Take 1 tablet by mouth daily.   nitroGLYCERIN  0.4 MG SL tablet Commonly known as: NITROSTAT  Place 1 tablet (0.4 mg total) under the tongue every 5 (five) minutes as needed for chest pain.   oxyCODONE  5 MG immediate release tablet Commonly known as: Roxicodone  Take 1 tablet (5 mg total) by mouth every 4 (four) hours as needed for up to 3 days for severe pain (pain score 7-10).   rosuvastatin 10 MG tablet Commonly known as: CRESTOR Take 10 mg by mouth every morning.   senna 8.6 MG Tabs tablet Commonly known as: SENOKOT Take 1 tablet (8.6 mg total) by mouth 2 (two) times daily as needed.   traZODone  100 MG tablet Commonly known as: DESYREL  Take 100 mg by mouth at bedtime.         Signed: Noble Bateman 10/17/2023, 11:11 AM

## 2023-10-17 NOTE — Anesthesia Preprocedure Evaluation (Signed)
 Anesthesia Evaluation  Patient identified by MRN, date of birth, ID band Patient awake    Reviewed: Allergy & Precautions, NPO status , Patient's Chart, lab work & pertinent test results  History of Anesthesia Complications Negative for: history of anesthetic complications  Airway Mallampati: II  TM Distance: >3 FB Neck ROM: Full    Dental no notable dental hx. (+) Teeth Intact, Dental Advisory Given   Pulmonary neg pulmonary ROS, neg sleep apnea, neg COPD, Patient abstained from smoking.Not current smoker, former smoker   Pulmonary exam normal breath sounds clear to auscultation       Cardiovascular Exercise Tolerance: Good hypertension, + CAD  (-) Past MI (-) dysrhythmias  Rhythm:Regular Rate:Normal - Systolic murmurs TTE 2020: 1. Left ventricular ejection fraction, by visual estimation, is 70 to  75%. The left ventricle has hyperdynamic function. Normal left ventricular  size. There is mildly increased left ventricular hypertrophy.  2. Left ventricular diastolic Doppler parameters are consistent with  impaired relaxation pattern of LV diastolic filling.  3. Global right ventricle has normal systolic function.The right  ventricular size is mildly enlarged. No increase in right ventricular wall  thickness.  4. Left atrial size was normal.  5. Right atrial size was mildly dilated.  6. Trivial pericardial effusion is present.  7. Mild aortic valve annular calcification.  8. Moderate mitral annular calcification.  9. The mitral valve is abnormal. Trace mitral valve regurgitation.  10. The tricuspid valve is normal in structure. Tricuspid valve  regurgitation is trivial.  11. The aortic valve is tricuspid Aortic valve regurgitation was not  visualized by color flow Doppler. Mild to moderate aortic valve  sclerosis/calcification without any evidence of aortic stenosis.  12. The pulmonic valve was not well visualized.  Pulmonic valve  regurgitation is not visualized by color flow Doppler.  13. TR signal is inadequate for assessing pulmonary artery systolic  pressure.  14. The inferior vena cava is normal in size with greater than 50%  respiratory variability, suggesting right atrial pressure of 3 mmHg.  15. Interatrial shunt cannot be excluded.  16. There is redundancy of the interatrial septum.  17. There is right bowing of the interatrial septum, suggestive of  elevated left atrial pressure.    Neuro/Psych  PSYCHIATRIC DISORDERS Anxiety Depression    TIA   GI/Hepatic ,GERD  Controlled,,(+)     (-) substance abuse    Endo/Other  neg diabetes    Renal/GU      Musculoskeletal   Abdominal   Peds  Hematology  (+) Blood dyscrasia, anemia   Anesthesia Other Findings Past Medical History: No date: Anemia No date: Arthritis     Comment:  RA No date: Chronic back pain No date: Chronic kidney disease     Comment:  frequent kidney infections No date: Coronary artery disease No date: Depression No date: GERD (gastroesophageal reflux disease) No date: Hyperlipidemia No date: Hypertension No date: Insomnia No date: Stroke [facility])     Comment:  TIA  Reproductive/Obstetrics                             Anesthesia Physical Anesthesia Plan  ASA: 3  Anesthesia Plan: General ETT   Post-op Pain Management:    Induction: Intravenous  PONV Risk Score and Plan: 3 and Ondansetron , Dexamethasone  and Midazolam   Airway Management Planned: Oral ETT  Additional Equipment:   Intra-op Plan:   Post-operative Plan: Extubation in OR  Informed Consent: I have  reviewed the patients History and Physical, chart, labs and discussed the procedure including the risks, benefits and alternatives for the proposed anesthesia with the patient or authorized representative who has indicated his/her understanding and acceptance.     Dental Advisory Given  Plan Discussed with:  Anesthesiologist, CRNA and Surgeon  Anesthesia Plan Comments: (Patient consented for risks of anesthesia including but not limited to:  - adverse reactions to medications - damage to eyes, teeth, lips or other oral mucosa - nerve damage due to positioning  - sore throat or hoarseness - Damage to heart, brain, nerves, lungs, other parts of body or loss of life  Patient voiced understanding and assent.)       Anesthesia Quick Evaluation

## 2023-10-17 NOTE — Discharge Instructions (Addendum)
 NEUROSURGERY DISCHARGE INSTRUCTIONS  Admission diagnosis: Postlaminectomy syndrome, lumbar region [M96.1] Chronic pain syndrome [G89.4]  Operative procedure: Placement of spinal cord stimulator  What to do after you leave the hospital:  Recommended diet: regular diet. Increase protein intake to promote wound healing.  Recommended activity: no lifting, driving, or strenuous exercise for 2 weeks. .You should walk multiple times per day  Special Instructions  No straining, no heavy lifting > 10lbs x 4 weeks.  Keep incision area clean and dry. May shower in 2 days. No baths or pools for 6 weeks.  Please remove dressing tomorrow, no need to apply a bandage afterwards  You have no sutures to remove, the skin is closed with adhesive  Please take pain medications as directed. Take a stool softener if on pain medications  *Regarding compression stockings-  Please wear day and night until you are walking a couple hundred feet three times a day.   Please Report any of the following: Nausea or Vomiting, Temperature is greater than 101.50F (38.1C) degrees, Dizziness, Abdominal Pain, Difficulty Breathing or Shortness of Breath, Inability to Eat, drink Fluids, or Take medications, Bleeding, swelling, or drainage from surgical incision sites, New numbness or weakness, and Bowel or bladder dysfunction to the neurosurgeon on call. How to contact us :  If you have any questions/concerns before or after surgery, you can reach us  at [phone], or you can send a mychart message. We can be reached by phone or mychart 8am-4pm, Monday-Friday.  *Please note: Calls after 4pm are forwarded to a third party answering service. Mychart messages are not routinely monitored during evenings, weekends, and holidays. Please call our office to contact the answering service for urgent concerns during non-business hours.   Additional Follow up appointments Please follow up with Lucetta Russel PA-C as scheduled in 2-3  weeks   Please see below for scheduled appointments:  Future Appointments  Date Time Provider Department Center  10/31/2023 11:30 AM Lucetta Russel, PA-C [facility] None  11/20/2023 10:45 AM Jodeen Munch, MD [facility] None

## 2023-10-17 NOTE — Anesthesia Postprocedure Evaluation (Signed)
 Anesthesia Post Note  Patient: Emma Houston  Procedure(s) Performed: THORACIC LAMINECTOMY FOR SPINAL CORD STIMULATOR  Patient location during evaluation: PACU Anesthesia Type: General Level of consciousness: awake and alert Pain management: pain level controlled Vital Signs Assessment: post-procedure vital signs reviewed and stable Respiratory status: spontaneous breathing, nonlabored ventilation, respiratory function stable and patient connected to nasal cannula oxygen Cardiovascular status: blood pressure returned to baseline and stable Postop Assessment: no apparent nausea or vomiting Anesthetic complications: no  No notable events documented.   Last Vitals:  Vitals:   10/17/23 1130 10/17/23 1145  BP: 127/87 (!) 142/62  Pulse: (!) 55 (!) 57  Resp: (!) 24 16  Temp: (!) 36.1 C   SpO2: 93% 96%    Last Pain:  Vitals:   10/17/23 1145  PainSc: 0-No pain                 Enrique Harvest

## 2023-10-17 NOTE — Interval H&P Note (Signed)
 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  The various methods of treatment have been discussed with the patient and family. After consideration of risks, benefits and other options for treatment, the patient has consented to  Procedure(s) with comments: THORACIC LAMINECTOMY FOR SPINAL CORD STIMULATOR (N/A) - THORACIC LAMINECTOMY FOR SPINAL CORD STIMULATOR PLACEMENT as a surgical intervention.  The patient's history has been reviewed, patient examined, no change in status, stable for surgery.  I have reviewed the patient's chart and labs.  Questions were answered to the patient's satisfaction.    Heart sounds normal no MRG. Chest Clear to Auscultation Bilaterally.  Danny Yackley

## 2023-10-17 NOTE — Transfer of Care (Signed)
 Immediate Anesthesia Transfer of Care Note  Patient: Emma Houston  Procedure(s) Performed: THORACIC LAMINECTOMY FOR SPINAL CORD STIMULATOR  Patient Location: PACU  Anesthesia Type:General  Level of Consciousness: awake and alert   Airway & Oxygen Therapy: Patient Spontanous Breathing and Patient connected to face mask oxygen  Post-op Assessment: Report given to RN and Post -op Vital signs reviewed and stable  Post vital signs: stable  Last Vitals:  Vitals Value Taken Time  BP 119/74 10/17/23 1115  Temp    Pulse 57 10/17/23 1117  Resp 19 10/17/23 1117  SpO2 99 % 10/17/23 1117  Vitals shown include unfiled device data.  Last Pain:  Vitals:   10/17/23 0825  PainSc: 0-No pain         Complications: No notable events documented.

## 2023-10-17 NOTE — Anesthesia Procedure Notes (Signed)
 Procedure Name: Intubation Date/Time: 10/17/2023 7:26 PM  Performed by: Wilkins Hardy I, CRNAPre-anesthesia Checklist: Patient identified, Emergency Drugs available, Suction available and Patient being monitored Patient Re-evaluated:Patient Re-evaluated prior to induction Oxygen Delivery Method: Circle system utilized Preoxygenation: Pre-oxygenation with 100% oxygen Induction Type: IV induction Ventilation: Mask ventilation without difficulty Laryngoscope Size: McGrath and 3 Tube type: Oral Tube size: 6.5 mm Number of attempts: 1 Airway Equipment and Method: Stylet and Oral airway Placement Confirmation: ETT inserted through vocal cords under direct vision, positive ETCO2 and breath sounds checked- equal and bilateral Secured at: 19 cm Tube secured with: Tape Dental Injury: Teeth and Oropharynx as per pre-operative assessment

## 2023-10-17 NOTE — Op Note (Signed)
 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 The patient had a successful trial for spinal cord stimulation, so was consented for placement of a permanent device   Findings: successful placement of a [employer] spinal cord stimulator.   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     Preoperative Note:    Risks of surgery discussed in clinic.   Operative Note:      The patient was then brought from the preoperative center with intravenous access established.  The patient underwent general anesthesia and endotracheal tube intubation, then was rotated on the [facility] table where all pressure points were appropriately padded.  An incision was marked with flouroscopy at T9/10, and on the left flank. The skin was then thoroughly cleansed.  Perioperative antibiotic prophylaxis was administered.  Sterile prep and drapes were then applied and a timeout was then observed.     Once this was complete an incision was opened with the use of a #10 blade knife in the midline at the thoracic incision.  The paraspinus muscled were subperiosteally dissected until the laminae of T9 and T10 were visualized. Flouroscopy was used to confirm the level. A self-retaining retractor was placed.   The rongeur was used to remove the spinous process of T9.  The drill was used to thin the bone until the ligamentum flavum was visualized.  The ligamentum was then removed and the dura visualized. This was widened until placement of the paddle lead was possible.     The lead was then advanced to the T7/8 disc space at the top of the lead.  The lead was secured with a 2-0 silk suture.     The  incision on the flank was then opened and a pocket formed until it was large enough for the pulse generator.  The tunneler was used to connect between the pocket and the incision.  The lead was inserted into the tunneler and tunneled to the flank.  The leads were attached to the IPG and impedances checked.  The leads were then tightened.  The IPG was then inserted into the pouch.   Both sites were irrigated.  The wounds were closed in layers with 0 and 2-0 vicryl.  The skin was approximated with monocryl. A sterile dressing was applied.   Monitoring was stable throughout.   Patient was then rotated back to the preoperative bed awakened from anesthesia and taken to recovery. All counts are correct in this case.   I performed the entire procedure with the assistance of Anastacio Karvonen PA as an Designer, television/film set. An assistant was required for this procedure due to the complexity.  The assistant provided assistance in tissue manipulation and suction, and was required for the successful and safe performance of the procedure. I performed the critical portions of the procedure.      Jodeen Munch MD

## 2023-10-18 ENCOUNTER — Encounter: Payer: Self-pay | Admitting: Neurosurgery

## 2023-10-22 NOTE — Progress Notes (Signed)
   REFERRING PHYSICIAN:  No referring provider defined for this encounter.  DOS: 10/17/23  placement of Med[employer]onic SCS  HISTORY OF PRESENT ILLNESS: Emma Houston is approximately 2 weeks status post above surgery. Was given oxycodone  and robaxin  on discharge from the hospital.   She has expected postop pain at her incisions. Still with her preop LBP.   PHYSICAL EXAMINATION:  General: Patient is well developed, well nourished, calm, collected, and in no apparent distress.   NEUROLOGICAL:  General: In no acute distress.   Awake, alert, oriented to person, place, and time.  Pupils equal round and reactive to light.  Facial tone is symmetric.     Strength:            Side Iliopsoas Quads Hamstring PF DF EHL  R 5 5 5 5 5 5   L 5 5 5 5 5 5    Incisions c/d/i   ROS (Neurologic):  Negative except as noted above  IMAGING: Nothing new to review.   ASSESSMENT/PLAN:  Emma Houston is doing well s/p above surgery. Treatment options reviewed with patient and following plan made:   - I have advised the patient to lift up to 10 pounds until 6 weeks after surgery (follow up with Dr. Mont Antis).  - Reviewed wound care.  - No bending, twisting, or lifting.  - [employer] SCS rep met with patient today for programming.  - Follow up as scheduled in 4 weeks and prn.   Advised to contact the office if any questions or concerns arise.  Emma Russel PA-C Department of neurosurgery

## 2023-10-24 ENCOUNTER — Encounter: Admitting: Orthopedic Surgery

## 2023-10-31 ENCOUNTER — Ambulatory Visit (INDEPENDENT_AMBULATORY_CARE_PROVIDER_SITE_OTHER): Admitting: Orthopedic Surgery

## 2023-10-31 ENCOUNTER — Encounter: Payer: Self-pay | Admitting: Orthopedic Surgery

## 2023-10-31 VITALS — BP 120/72 | Temp 97.9°F | Ht 64.0 in | Wt 124.0 lb

## 2023-10-31 DIAGNOSIS — M961 Postlaminectomy syndrome, not elsewhere classified: Secondary | ICD-10-CM

## 2023-10-31 DIAGNOSIS — Z9689 Presence of other specified functional implants: Secondary | ICD-10-CM

## 2023-11-20 ENCOUNTER — Encounter: Payer: Self-pay | Admitting: Neurosurgery

## 2023-11-20 ENCOUNTER — Ambulatory Visit (INDEPENDENT_AMBULATORY_CARE_PROVIDER_SITE_OTHER): Admitting: Neurosurgery

## 2023-11-20 VITALS — BP 122/84 | Temp 98.0°F | Ht 64.0 in | Wt 124.0 lb

## 2023-11-20 DIAGNOSIS — Z09 Encounter for follow-up examination after completed treatment for conditions other than malignant neoplasm: Secondary | ICD-10-CM

## 2023-11-20 DIAGNOSIS — M961 Postlaminectomy syndrome, not elsewhere classified: Secondary | ICD-10-CM

## 2023-11-20 NOTE — Progress Notes (Signed)
   REFERRING PHYSICIAN:  No referring provider defined for this encounter.  DOS: 10/17/23  placement of Med[employer]onic SCS  HISTORY OF PRESENT ILLNESS: Emma Houston is status post above surgery.   She is doing better.  PHYSICAL EXAMINATION:  General: Patient is well developed, well nourished, calm, collected, and in no apparent distress.   NEUROLOGICAL:  General: In no acute distress.   Awake, alert, oriented to person, place, and time.  Pupils equal round and reactive to light.  Facial tone is symmetric.     Strength:            Side Iliopsoas Quads Hamstring PF DF EHL  R 5 5 5 5 5 5   L 5 5 5 5 5 5    Incisions c/d/i   ROS (Neurologic):  Negative except as noted above  IMAGING: Nothing new to review.   ASSESSMENT/PLAN:  Emma Houston is doing well s/p above surgery.   Released to begin increasing activity. Follow up PRN  Reeves Daisy Department of neurosurgery

## 2023-11-26 DIAGNOSIS — R634 Abnormal weight loss: Secondary | ICD-10-CM | POA: Insufficient documentation

## 2024-02-06 ENCOUNTER — Telehealth: Payer: Self-pay

## 2024-02-06 NOTE — Telephone Encounter (Signed)
 I spoke with Ms Bowermaster's daughter about the appointment for 02/07/24. She reports that they initially made the appointment for tomorrow because she was having pain after a fall. However, they have met with the [employer] rep recently and they no longer need the appointment for tomorrow. Per her daughter's request, I have canceled the appointment for tomorrow.

## 2024-02-07 ENCOUNTER — Ambulatory Visit: Admitting: Neurosurgery

## 2024-04-21 ENCOUNTER — Other Ambulatory Visit (HOSPITAL_COMMUNITY): Payer: Self-pay

## 2024-05-06 ENCOUNTER — Ambulatory Visit

## 2024-05-06 VITALS — BP 121/68 | HR 57 | Ht 64.0 in | Wt 118.6 lb

## 2024-05-06 DIAGNOSIS — N1831 Chronic kidney disease, stage 3a: Secondary | ICD-10-CM

## 2024-05-06 DIAGNOSIS — F32A Depression, unspecified: Secondary | ICD-10-CM

## 2024-05-06 DIAGNOSIS — R634 Abnormal weight loss: Secondary | ICD-10-CM

## 2024-05-06 DIAGNOSIS — Z9181 History of falling: Secondary | ICD-10-CM

## 2024-05-06 DIAGNOSIS — D508 Other iron deficiency anemias: Secondary | ICD-10-CM

## 2024-05-06 DIAGNOSIS — R413 Other amnesia: Secondary | ICD-10-CM

## 2024-05-06 DIAGNOSIS — I10 Essential (primary) hypertension: Secondary | ICD-10-CM

## 2024-05-06 DIAGNOSIS — F03B0 Unspecified dementia, moderate, without behavioral disturbance, psychotic disturbance, mood disturbance, and anxiety: Secondary | ICD-10-CM

## 2024-05-06 DIAGNOSIS — G459 Transient cerebral ischemic attack, unspecified: Secondary | ICD-10-CM

## 2024-05-06 DIAGNOSIS — M8580 Other specified disorders of bone density and structure, unspecified site: Secondary | ICD-10-CM

## 2024-05-06 DIAGNOSIS — G4709 Other insomnia: Secondary | ICD-10-CM

## 2024-05-06 DIAGNOSIS — I8390 Asymptomatic varicose veins of unspecified lower extremity: Secondary | ICD-10-CM | POA: Insufficient documentation

## 2024-05-06 NOTE — Progress Notes (Signed)
 New Patient Visit   Physician: Lorayne Getchell A Ty Buntrock, MD  Patient: Emma Houston   DOB: 08-24-1940   83 y.o. Female  MRN: [id number] Visit Date: 05/06/2024   Chief Complaint  Patient presents with   Establish Care   Subjective  Emma Houston is a 83 y.o. female who presents today as a new patient to establish care.   HPI  Discussed the use of AI scribe software for clinical note transcription with the patient, who gave verbal consent to proceed.  History of Present Illness   Emma Houston is an 83 year old female with chronic kidney disease stage four who presents to establish care.  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 - Physical activity provides some relief, but certain activities still cause pain  Hypertension - Blood pressure well-controlled at 121/68 mmHg - Managed with amlodipine 2.5 mg in the morning and carvedilol  3.125 mg twice daily - No dizziness or lightheadedness  Hyperlipidemia and cerebrovascular disease - On rosuvastatin 10 mg daily - History of transient ischemic attack in the early 1990s with no recurrence  Mood disorder and anxiety - History of depression and anxiety esp over the last several years - Takes bupropion 300 mg daily and buspirone twice daily for the past four years - Some improvement in mood  Weight loss - Weight loss - now 130--->118 lbs.   She reports eating in the day, but does not eat unless dinner made for her  - Boost and multivitamin use at home  - Reports not being hungry  frequently.   Insomnia - Difficulty sleeping - Takes trazodone  100 mg at bedtime - No morning grogginess  Cognitive impairment - Short-term memory loss and repetition of information.  Cognitive dysfunction worsened significantly over the last year - Does not drive - Lives with family member  Tobacco and alcohol use - Former social smoker, quit 35 years ago - Does not consume alcohol regularly     - Did receive influenza vaccine.   Due for shingles.  Pneumonia vaccine status unknown     ASSESSMENT & PLAN  Encounter Diagnoses  Name Primary?   Memory changes Yes   Other iron deficiency anemia    Depression, unspecified depression type    Stage 3a chronic kidney disease (HCC)    TIA (transient ischemic attack)    Other insomnia    Benign essential hypertension    At risk for falls    Weight loss    Asymptomatic varicose veins    Osteopenia, unspecified location    Moderate dementia without behavioral disturbance, psychotic disturbance, mood disturbance, or anxiety, unspecified dementia type (HCC)     Orders Placed This Encounter  Procedures   DG Bone Density   MR BRAIN WO CONTRAST   CBC with Differential/Platelet   Comprehensive metabolic panel with GFR   Hemoglobin A1c   Lipid panel   Urinalysis, Routine w reflex microscopic   B12 and Folate Panel   Iron, TIBC and Ferritin Panel   VITAMIN D 25 Hydroxy (Vit-D Deficiency, Fractures)   TSH + free T4   Renal function panel  Microalbumin / creatinine urine ratio    Assessment and Plan    Memory impairment - Dementia suspect Progressive short-term memory loss with concerns about age-related or vascular changes. - Ordered MRI of the brain. - Documented mental status score for baseline - possibly at next visit.  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 Avoidance of NSAIDs due to kidney  impact. - Continue spinal cord stimulator. - Use Tylenol  as needed.  Overall stable situation  Insomnia Chronic insomnia managed with trazodone  without morning grogginess. - Continue trazodone  100 mg at bedtime.  Depression and anxiety Mood well-managed with bupropion and buspirone. Anxiety may be linked to memory loss. - Continue bupropion 300 mg daily and buspirone twice daily. - Monitor mood and anxiety levels.  Hypertension Well-controlled with amlodipine and carvedilol . Blood pressure stable. - Continue amlodipine 2.5 mg in the morning and carvedilol  3.125 mg BID. - Monitor blood pressure regularly.  We may be able to reduce to one medication   Hyperlipidemia Managed with rosuvastatin. Discussed necessity of statin therapy. - Continue rosuvastatin 10 mg daily. - Reassess need for statin based on MRI and health status.  Chronic kidney disease stage 4 Baseline creatinine of 2 and GFR in mid-20s. Vascular etiology suggested. - Ordered baseline blood work. - Schedule follow-up with nephrology in one year.  Appetite loss and weight loss Significant weight loss with poor appetite, possibly related to memory or medication. - Scheduled meals. - Consider nutritional supplements like Ensure. - Ordered blood work for deficiencies.  Varicose veins, left leg Large varicosity with potential for ulceration. - Monitor for changes or ulceration.  Doppler for further evaluation given size  General health maintenance Discussion about vaccinations and screenings. No recent flu shot, shingles vaccine, or bone density scan. - Recommended flu shot and shingles vaccine. - Ordered bone density scan.      F/u to review labs, will try to reduce polypharmacy where feasable.        Objective  BP 121/68   Pulse (!) 57   Ht 5' 4 (1.626 m)   Wt 118 lb 9.6 oz (53.8 kg)   SpO2 98%   BMI 20.36 kg/m      Review of Systems  Constitutional:  Negative for chills, fever and weight loss.   Eyes:  Negative for blurred vision. h Respiratory:  Negative for cough and shortness of breath.   Cardiovascular:  Negative for chest pain and palpitations.  Skin:  Negative for rash.  Psychiatric/Behavioral:  Negative for depression. The patient is not nervous/anxious.      Physical Exam Physical Exam Vitals reviewed.  Constitutional:      Appearance: Normal appearance. Well-developed with normal weight.  HENT:     Head: Normocephalic and atraumatic.  Normal mucous membranes, no oral lesions Eyes:     Pupils: Pupils are equal, round, and reactive to light.  Neck:     Thyroid : No thyroid  mass or thyromegaly.  Cardiovascular:     Rate and Rhythm: Normal rate and regular rhythm. Normal heart sounds. Normal peripheral pulses Pulmonary:     Normal breath sounds with normal effort Abdominal:   Abdomen is soft, without tenderness or noted hepatosplenomegaly Musculoskeletal:        General: No swelling or edema  Lymphadenopathy:     Cervical: No cervical adenopathy.  Skin:    General: Skin is warm and dry without noticeable rash. Neurological:     General:  No focal deficit present.  Psychiatric:        Mood and Affect: Mood, behavior and cognition normal   Past Medical History:  Diagnosis Date   2010    Arthritis    RA   Chronic back pain    Chronic kidney disease    frequent kidney infections   Chronic pain syndrome    Coronary artery disease    Depression    Diverticulitis    GERD (gastroesophageal reflux disease)    GERD (gastroesophageal reflux disease) 08/29/2012   History of TIA (transient ischemic attack)    Hyperlipidemia    Hypertension    Hyponatremia 06/05/2019   IDA (iron deficiency anemia)    Insomnia    Pain of both hip joints 09/02/2019   Post laminectomy syndrome    Past Surgical History:  Procedure Laterality Date   ABDOMINAL HYSTERECTOMY     FRACTURE SURGERY Left    wrist   JOINT REPLACEMENT Right    knee   JOINT REPLACEMENT Left    knee    KNEE ARTHROSCOPY Left    LUMBAR LAMINECTOMY/DECOMPRESSION MICRODISCECTOMY N/A 11/12/2019   Procedure: L3-5 DECOMPRESSION;  Surgeon: Clois Fret, MD;  Location: [facility] OR[facility];  Service: Neurosurgery;  Laterality: N/A;   THORACIC LAMINECTOMY FOR SPINAL CORD STIMULATOR N/A 10/17/2023   Procedure: THORACIC LAMINECTOMY FOR SPINAL CORD STIMULATOR;  Surgeon: Clois Fret, MD;  Location: [facility];  Service: Neurosurgery;  Laterality: N/A;  THORACIC LAMINECTOMY FOR SPINAL CORD STIMULATOR PLACEMENT   No family status information on file.   No family history on file. Social History   Socioeconomic History   Marital status: Married    Spouse name: Not on file   Number of children: Not on file   Years of education: Not on file   Highest education level: Not on file  Occupational History   Not on file  Tobacco Use   Smoking status: Former    Current packs/day: 0.00    Types: Cigarettes    Quit date: 17    Years since quitting: 43.9   Smokeless tobacco: Never  Vaping Use   Vaping status: Never Used  Substance and Sexual Activity   Alcohol use: Yes    Alcohol/week: 1.0 standard drink of alcohol    Types: 1 Cans of beer per week    Comment: occassional wine   Drug use: Yes    Comment: prescribed for chronic back pain   Sexual activity: Not on file  Other Topics Concern   Not on file  Social History Narrative   Not on file   Social Drivers of Health   Financial Resource Strain: Low Risk  (05/14/2023)   Received from [facility]   Overall Financial Resource Strain (CARDIA)    Difficulty of Paying Living Expenses: Not very hard  Food Insecurity: No Food Insecurity (08/30/2023)   Received from [facility]   Hunger Vital Sign    Within the past 12 months, you worried that your food would run out before you got the money to buy more.: Never true    Within the past 12 months, the food you bought just didn't last and you didn't have money to get  more.: Never true  Transportation Needs: No Transportation Needs (08/30/2023)   Received from [facility] - Transportation    In the past 12 months, has lack of transportation kept you from medical appointments or from getting medications?: No    Lack  of Transportation (Non-Medical): No  Physical Activity: Not on file  Stress: Not on file  Social Connections: Not on file   Outpatient Medications Prior to Visit  Medication Sig   amLODipine (NORVASC) 2.5 MG tablet Take 2.5 mg by mouth every morning.   amLODipine (NORVASC) 2.5 MG tablet Take 2.5 mg by mouth 2 (two) times daily.   buPROPion (WELLBUTRIN XL) 300 MG 24 hr tablet Take 300 mg by mouth every morning.   buPROPion (WELLBUTRIN XL) 300 MG 24 hr tablet Take 300 mg by mouth daily.   busPIRone (BUSPAR) 5 MG tablet Take 1 tablet by mouth 2 (two) times daily.   carvedilol  (COREG ) 3.125 MG tablet Take 1 tablet (3.125 mg total) by mouth 2 (two) times daily with a meal.   carvedilol  (COREG ) 3.125 MG tablet Take 3.125 mg by mouth 2 (two) times daily with a meal.   Multiple Vitamins-Minerals (MULTIVITAMIN WITH MINERALS) tablet Take 1 tablet by mouth daily.   rosuvastatin (CRESTOR) 10 MG tablet Take 10 mg by mouth every morning.   rosuvastatin (CRESTOR) 10 MG tablet Take 10 mg by mouth daily.   traZODone  (DESYREL ) 100 MG tablet Take 100 mg by mouth at bedtime.   traZODone  (DESYREL ) 100 MG tablet Take 100 mg by mouth at bedtime.   lidocaine  (LIDODERM ) 5 % Place 1 patch onto the skin every 12 (twelve) hours. Remove & Discard patch within 12 hours or as directed by MD (Patient taking differently: Place 1 patch onto the skin as needed. Remove & Discard patch within 12 hours or as directed by MD)   [DISCONTINUED] hydrOXYzine (ATARAX) 50 MG tablet Take 50 mg by mouth every 6 (six) hours as needed.   [DISCONTINUED] methocarbamol  (ROBAXIN ) 500 MG tablet Take 1 tablet (500 mg total) by mouth every 6 (six) hours as needed.    [DISCONTINUED] nitroGLYCERIN  (NITROSTAT ) 0.4 MG SL tablet Place 1 tablet (0.4 mg total) under the tongue every 5 (five) minutes as needed for chest pain. (Patient not taking: Reported on 11/20/2023)   [DISCONTINUED] senna (SENOKOT) 8.6 MG TABS tablet Take 1 tablet (8.6 mg total) by mouth 2 (two) times daily as needed.   No facility-administered medications prior to visit.   No Known Allergies  Immunization History  Administered Date(s) Administered   Fluad Quad(high Dose 65+) 02/07/2019, 03/03/2021, 02/13/2022   INFLUENZA, HIGH DOSE SEASONAL PF 05/02/2013, 03/20/2014, 04/06/2015, 02/05/2019   Influenza Split 03/21/2016   Moderna Covid-19 Fall Seasonal Vaccine 36yrs & older 02/18/2022   Moderna Covid-19 Vaccine Bivalent Booster 103yrs & up 04/25/2021   Moderna Sars-Covid-2 Vaccination 07/04/2019, 08/08/2019   Pneumococcal Conjugate-13 03/20/2014   Pneumococcal Polysaccharide-23 02/07/2019   Respiratory Syncytial Virus Vaccine,Recomb Aduvanted(Arexvy) 02/18/2022    Health Maintenance  Topic Date Due   DTaP/Tdap/Td (1 - Tdap) Never done   Zoster Vaccines- Shingrix (1 of 2) Never done   Bone Density Scan  Never done   Influenza Vaccine  12/28/2023   COVID-19 Vaccine (5 - 2025-26 season) 01/28/2024   Medicare Annual Wellness (AWV)  05/13/2024   Pneumococcal Vaccine: 50+ Years  Completed   Meningococcal B Vaccine  Aged Out    Patient Care Team: Everlene Parris LABOR, MD as PCP - General (Family Medicine) Lillard Trena NOVAK, MD as Referring Physician (Family Medicine)  Depression Screen    09/12/2023    7:58 AM 09/05/2023    8:15 AM 07/12/2023    9:57 AM  PHQ 2/9 Scores  PHQ - 2 Score 0 0 3  Parris DELENA Juneau, MD  [facility] [phone] (phone) [phone] (fax)  [facility]

## 2024-05-07 LAB — URINALYSIS, ROUTINE W REFLEX MICROSCOPIC
Bilirubin Urine: NEGATIVE
Glucose, UA: NEGATIVE
Hgb urine dipstick: NEGATIVE
Ketones, ur: NEGATIVE
Nitrite: POSITIVE — AB
RBC / HPF: NONE SEEN /HPF (ref 0–2)
Specific Gravity, Urine: 1.016 (ref 1.001–1.035)
pH: 5.5 (ref 5.0–8.0)

## 2024-05-07 LAB — RENAL FUNCTION PANEL
Albumin: 4.7 g/dL (ref 3.6–5.1)
BUN/Creatinine Ratio: 26 (calc) — ABNORMAL HIGH (ref 6–22)
BUN: 48 mg/dL — ABNORMAL HIGH (ref 7–25)
CO2: 27 mmol/L (ref 20–32)
Calcium: 9.4 mg/dL (ref 8.6–10.4)
Chloride: 105 mmol/L (ref 98–110)
Creat: 1.82 mg/dL — ABNORMAL HIGH (ref 0.60–0.95)
Glucose, Bld: 76 mg/dL (ref 65–99)
Phosphorus: 4.2 mg/dL (ref 2.1–4.3)
Potassium: 4.8 mmol/L (ref 3.5–5.3)
Sodium: 137 mmol/L (ref 135–146)

## 2024-05-07 LAB — COMPREHENSIVE METABOLIC PANEL WITH GFR
AG Ratio: 2 (calc) (ref 1.0–2.5)
ALT: 15 U/L (ref 6–29)
AST: 22 U/L (ref 10–35)
Albumin: 4.7 g/dL (ref 3.6–5.1)
Alkaline phosphatase (APISO): 74 U/L (ref 37–153)
BUN/Creatinine Ratio: 26 (calc) — ABNORMAL HIGH (ref 6–22)
BUN: 48 mg/dL — ABNORMAL HIGH (ref 7–25)
CO2: 27 mmol/L (ref 20–32)
Calcium: 9.4 mg/dL (ref 8.6–10.4)
Chloride: 105 mmol/L (ref 98–110)
Creat: 1.82 mg/dL — ABNORMAL HIGH (ref 0.60–0.95)
Globulin: 2.4 g/dL (ref 1.9–3.7)
Glucose, Bld: 76 mg/dL (ref 65–99)
Potassium: 4.8 mmol/L (ref 3.5–5.3)
Sodium: 137 mmol/L (ref 135–146)
Total Bilirubin: 0.4 mg/dL (ref 0.2–1.2)
Total Protein: 7.1 g/dL (ref 6.1–8.1)
eGFR: 27 mL/min/1.73m2 — ABNORMAL LOW (ref >=60)

## 2024-05-07 LAB — MICROALBUMIN / CREATININE URINE RATIO
Creatinine, Urine: 103 mg/dL (ref 20–275)
Microalb Creat Ratio: 140 mg/g{creat} — ABNORMAL HIGH (ref <30)
Microalb, Ur: 14.4 mg/dL

## 2024-05-07 LAB — CBC WITH DIFFERENTIAL/PLATELET
Absolute Lymphocytes: 1495 {cells}/uL (ref 850–3900)
Absolute Monocytes: 338 {cells}/uL (ref 200–950)
Basophils Absolute: 59 {cells}/uL (ref 0–200)
Basophils Relative: 0.9 %
Eosinophils Absolute: 137 {cells}/uL (ref 15–500)
Eosinophils Relative: 2.1 %
HCT: 34.5 % — ABNORMAL LOW (ref 35.9–46.0)
Hemoglobin: 11 g/dL — ABNORMAL LOW (ref 11.7–15.5)
MCH: 29.9 pg (ref 27.0–33.0)
MCHC: 31.9 g/dL (ref 31.6–35.4)
MCV: 93.8 fL (ref 81.4–101.7)
MPV: 10.4 fL (ref 7.5–12.5)
Monocytes Relative: 5.2 %
Neutro Abs: 4472 {cells}/uL (ref 1500–7800)
Neutrophils Relative %: 68.8 %
Platelets: 274 Thousand/uL (ref 140–400)
RBC: 3.68 Million/uL — ABNORMAL LOW (ref 3.80–5.10)
RDW: 13.3 % (ref 11.0–15.0)
Total Lymphocyte: 23 %
WBC: 6.5 Thousand/uL (ref 3.8–10.8)

## 2024-05-07 LAB — HEMOGLOBIN A1C
Hgb A1c MFr Bld: 5.4 % (ref <5.7)
Mean Plasma Glucose: 108 mg/dL
eAG (mmol/L): 6 mmol/L

## 2024-05-07 LAB — LIPID PANEL
Cholesterol: 184 mg/dL (ref <200)
HDL: 71 mg/dL (ref >=50)
LDL Cholesterol (Calc): 88 mg/dL
Non-HDL Cholesterol (Calc): 113 mg/dL (ref <130)
Total CHOL/HDL Ratio: 2.6 (calc) (ref <5.0)
Triglycerides: 148 mg/dL (ref <150)

## 2024-05-07 LAB — IRON,TIBC AND FERRITIN PANEL
%SAT: 37 % (ref 16–45)
Ferritin: 85 ng/mL (ref 16–288)
Iron: 102 ug/dL (ref 45–160)
TIBC: 278 ug/dL (ref 250–450)

## 2024-05-07 LAB — VITAMIN D 25 HYDROXY (VIT D DEFICIENCY, FRACTURES): Vit D, 25-Hydroxy: 43 ng/mL (ref 30–100)

## 2024-05-07 LAB — TSH+FREE T4: TSH W/REFLEX TO FT4: 3.75 m[IU]/L (ref 0.40–4.50)

## 2024-05-07 LAB — B12 AND FOLATE PANEL
Folate: >24 ng/mL
Vitamin B-12: 611 pg/mL (ref 200–1100)

## 2024-05-07 LAB — MICROSCOPIC MESSAGE

## 2024-05-08 ENCOUNTER — Ambulatory Visit

## 2024-05-09 ENCOUNTER — Other Ambulatory Visit (HOSPITAL_COMMUNITY): Payer: Self-pay

## 2024-05-23 ENCOUNTER — Ambulatory Visit
Admission: RE | Admit: 2024-05-23 | Discharge: 2024-05-23 | Disposition: A | Discharge status: Home / Self Care | Source: Ambulatory Visit

## 2024-05-23 DIAGNOSIS — F03B0 Unspecified dementia, moderate, without behavioral disturbance, psychotic disturbance, mood disturbance, and anxiety: Secondary | ICD-10-CM | POA: Insufficient documentation

## 2024-05-23 DIAGNOSIS — R413 Other amnesia: Secondary | ICD-10-CM | POA: Insufficient documentation

## 2024-05-26 ENCOUNTER — Ambulatory Visit

## 2024-05-26 VITALS — BP 144/74 | HR 67 | Ht 64.0 in | Wt 122.2 lb

## 2024-05-26 DIAGNOSIS — G4709 Other insomnia: Secondary | ICD-10-CM

## 2024-05-26 DIAGNOSIS — D649 Anemia, unspecified: Secondary | ICD-10-CM | POA: Insufficient documentation

## 2024-05-26 DIAGNOSIS — N184 Chronic kidney disease, stage 4 (severe): Secondary | ICD-10-CM | POA: Diagnosis not present

## 2024-05-26 DIAGNOSIS — D631 Anemia in chronic kidney disease: Secondary | ICD-10-CM

## 2024-05-26 MED ORDER — RAMELTEON 8 MG PO TABS: 8.0000 mg | ORAL_TABLET | Freq: Every day | ORAL | 0 refills | Status: DC

## 2024-05-26 NOTE — Progress Notes (Unsigned)
 "           Progress Note  Physician: Laruth Hanger A Ellyn Rubiano, MD   HPI: Emma Houston is a 83 y.o. female presenting on 05/26/2024 for Follow-up (Patient is having trouble sleeping.) .  Discussed the use of AI scribe software for clinical note transcription with the patient, who gave verbal consent to proceed.  History of Present Illness   Emma Houston is an 83 year old female with chronic kidney disease who presents for follow-up and evaluation of memory concerns. She is accompanied by her daughter.  Cognitive impairment - Gradual decline in memory over time.  Clear dementia presentaion - No significant anxiety or depression but is on medication for depression - Possible prior transient ischemic attack per daughter, but unconfirmed  Chronic kidney disease - Current GFR is 28, stable compared to previous results - Last nephrology consultation in the summer - Annual nephrology follow-up scheduled  Sleep disturbance - Long-standing insomnia - Uses tramadol for sleep, but it is not effective - Daughter observes poor rest   Medical history:  Relevant past medical, surgical, family and social history reviewed and updated as indicated. Interim medical history since our last visit reviewed.  Allergies and medications reviewed and updated.   ROS: Negative unless specifically indicated above in HPI.   Current Medications[1]      Recent Results (from the past 2160 hours)  CBC with Differential/Platelet     Status: Abnormal   Collection Time: 05/06/24 11:58 AM  Result Value Ref Range   WBC 6.5 3.8 - 10.8 Thousand/uL   RBC 3.68 (L) 3.80 - 5.10 Million/uL   Hemoglobin 11.0 (L) 11.7 - 15.5 g/dL   HCT 65.4 (L) 64.0 - 53.9 %   MCV 93.8 81.4 - 101.7 fL   MCH 29.9 27.0 - 33.0 pg   MCHC 31.9 31.6 - 35.4 g/dL   RDW 86.6 88.9 - 84.9 %   Platelets 274 140 - 400 Thousand/uL   MPV 10.4 7.5 - 12.5 fL   Neutro Abs 4,472 1,500 - 7,800 cells/uL   Absolute Lymphocytes 1,495  850 - 3,900 cells/uL   Absolute Monocytes 338 200 - 950 cells/uL   Eosinophils Absolute 137 15 - 500 cells/uL   Basophils Absolute 59 0 - 200 cells/uL   Neutrophils Relative % 68.8 %   Total Lymphocyte 23.0 %   Monocytes Relative 5.2 %   Eosinophils Relative 2.1 %   Basophils Relative 0.9 %  Comprehensive metabolic panel with GFR     Status: Abnormal   Collection Time: 05/06/24 11:58 AM  Result Value Ref Range   Glucose, Bld 76 65 - 99 mg/dL    Comment: .            Fasting reference interval .    BUN 48 (H) 7 - 25 mg/dL   Creat 8.17 (H) 9.39 - 0.95 mg/dL   eGFR 27 (L) > OR = 60 mL/min/1.105m2   BUN/Creatinine Ratio 26 (H) 6 - 22 (calc)   Sodium 137 135 - 146 mmol/L   Potassium 4.8 3.5 - 5.3 mmol/L   Chloride 105 98 - 110 mmol/L   CO2 27 20 - 32 mmol/L   Calcium  9.4 8.6 - 10.4 mg/dL   Total Protein 7.1 6.1 - 8.1 g/dL   Albumin 4.7 3.6 - 5.1 g/dL   Globulin 2.4 1.9 - 3.7 g/dL (calc)   AG Ratio 2.0 1.0 - 2.5 (calc)   Total Bilirubin 0.4 0.2 - 1.2 mg/dL   Alkaline phosphatase (  APISO) 74 37 - 153 U/L   AST 22 10 - 35 U/L   ALT 15 6 - 29 U/L  Hemoglobin A1c     Status: None   Collection Time: 05/06/24 11:58 AM  Result Value Ref Range   Hgb A1c MFr Bld 5.4 <5.7 %    Comment: For the purpose of screening for the presence of diabetes: . <5.7%       Consistent with the absence of diabetes 5.7-6.4%    Consistent with increased risk for diabetes             (prediabetes) > or =6.5%  Consistent with diabetes . This assay result is consistent with a decreased risk of diabetes. . Currently, no consensus exists regarding use of hemoglobin A1c for diagnosis of diabetes in children. . According to American Diabetes Association (ADA) guidelines, hemoglobin A1c <7.0% represents optimal control in non-pregnant diabetic patients. Different metrics may apply to specific patient populations.  Standards of Medical Care in Diabetes(ADA). .    Mean Plasma Glucose 108 mg/dL   eAG  (mmol/L) 6.0 mmol/L  Lipid panel     Status: None   Collection Time: 05/06/24 11:58 AM  Result Value Ref Range   Cholesterol 184 <200 mg/dL   HDL 71 > OR = 50 mg/dL   Triglycerides 851 <849 mg/dL   LDL Cholesterol (Calc) 88 mg/dL (calc)    Comment: Reference range: <100 . Desirable range <100 mg/dL for primary prevention;   <70 mg/dL for patients with CHD or diabetic patients  with > or = 2 CHD risk factors. SABRA LDL-C is now calculated using the Martin-Hopkins  calculation, which is a validated novel method providing  better accuracy than the Friedewald equation in the  estimation of LDL-C.  Gladis APPLETHWAITE et al. SANDREA. 7986;689(80): 2061-2068  (http://education.[facility].com/faq/FAQ164)    Total CHOL/HDL Ratio 2.6 <5.0 (calc)   Non-HDL Cholesterol (Calc) 113 <130 mg/dL (calc)    Comment: For patients with diabetes plus 1 major ASCVD risk  factor, treating to a non-HDL-C goal of <100 mg/dL  (LDL-C of <29 mg/dL) is considered a therapeutic  option.   Urinalysis, Routine w reflex microscopic     Status: Abnormal   Collection Time: 05/06/24 11:58 AM  Result Value Ref Range   Color, Urine YELLOW YELLOW   APPearance CLOUDY (A) CLEAR   Specific Gravity, Urine 1.016 1.001 - 1.035   pH 5.5 5.0 - 8.0   Glucose, UA NEGATIVE NEGATIVE   Bilirubin Urine NEGATIVE NEGATIVE   Ketones, ur NEGATIVE NEGATIVE   Hgb urine dipstick NEGATIVE NEGATIVE   Protein, ur 1+ (A) NEGATIVE   Nitrite POSITIVE (A) NEGATIVE   Leukocytes,Ua 2+ (A) NEGATIVE   WBC, UA 20-40 (A) 0 - 5 /HPF   RBC / HPF NONE SEEN 0 - 2 /HPF   Squamous Epithelial / HPF 0-5 < OR = 5 /HPF   Bacteria, UA MANY (A) NONE SEEN /HPF   Hyaline Cast 6-10 (A) NONE SEEN /LPF  B12 and Folate Panel     Status: None   Collection Time: 05/06/24 11:58 AM  Result Value Ref Range   Vitamin B-12 611 200 - 1,100 pg/mL   Folate >24.0 ng/mL    Comment:                            Reference Range  Low:           <3.4                             Borderline:    3.4-5.4                            Normal:        >5.4 .   Iron, TIBC and Ferritin Panel     Status: None   Collection Time: 05/06/24 11:58 AM  Result Value Ref Range   Iron 102 45 - 160 mcg/dL   TIBC 721 749 - 549 mcg/dL (calc)   %SAT 37 16 - 45 % (calc)   Ferritin 85 16 - 288 ng/mL  VITAMIN D  25 Hydroxy (Vit-D Deficiency, Fractures)     Status: None   Collection Time: 05/06/24 11:58 AM  Result Value Ref Range   Vit D, 25-Hydroxy 43 30 - 100 ng/mL    Comment: Vitamin D  Status         25-OH Vitamin D : . Deficiency:                    <20 ng/mL Insufficiency:             20 - 29 ng/mL Optimal:                 > or = 30 ng/mL . For 25-OH Vitamin D  testing on patients on  D2-supplementation and patients for whom quantitation  of D2 and D3 fractions is required, the [facility](TM) 25-OH VIT D, (D2,D3), LC/MS/MS is recommended: order  code 07111 (patients >75yrs). . See Note 1 . Note 1 . For additional information, please refer to  http://education.[facility].com/faq/FAQ199  (This link is being provided for informational/ educational purposes only.)   TSH + free T4     Status: None   Collection Time: 05/06/24 11:58 AM  Result Value Ref Range   TSH W/REFLEX TO FT4 3.75 0.40 - 4.50 mIU/L  Renal function panel     Status: Abnormal   Collection Time: 05/06/24 11:58 AM  Result Value Ref Range   Glucose, Bld 76 65 - 99 mg/dL    Comment: .            Fasting reference interval .    BUN 48 (H) 7 - 25 mg/dL   Creat 8.17 (H) 9.39 - 0.95 mg/dL   BUN/Creatinine Ratio 26 (H) 6 - 22 (calc)   Sodium 137 135 - 146 mmol/L   Potassium 4.8 3.5 - 5.3 mmol/L   Chloride 105 98 - 110 mmol/L   CO2 27 20 - 32 mmol/L   Calcium  9.4 8.6 - 10.4 mg/dL   Phosphorus 4.2 2.1 - 4.3 mg/dL   Albumin 4.7 3.6 - 5.1 g/dL  Microalbumin / creatinine urine ratio     Status: Abnormal   Collection Time: 05/06/24 11:58 AM  Result Value Ref Range   Creatinine,  Urine 103 20 - 275 mg/dL   Microalb, Ur 85.5 mg/dL    Comment: Reference Range Not established    Microalb Creat Ratio 140 (H) <30 mg/g creat    Comment: . The ADA defines abnormalities in albumin excretion as follows: SABRA Albuminuria Category        Result (mg/g creatinine) . Normal to Mildly increased   <30 Moderately increased         30-299  Severely increased           > OR = 300 . The ADA recommends that at least two of three specimens collected within a 3-6 month period be abnormal before considering a patient to be within a diagnostic category.   MICROSCOPIC MESSAGE     Status: None   Collection Time: 05/06/24 11:58 AM  Result Value Ref Range   Note      Comment: This urine was analyzed for the presence of WBC,  RBC, bacteria, casts, and other formed elements.  Only those elements seen were reported. . .      Objective:     BP (!) 144/74 (BP Location: Right Arm, Patient Position: Sitting, Cuff Size: Normal)   Pulse 67   Ht 5' 4 (1.626 m)   Wt 122 lb 3.2 oz (55.4 kg)   SpO2 93%   BMI 20.98 kg/m   Wt Readings from Last 3 Encounters:  05/26/24 122 lb 3.2 oz (55.4 kg)  05/06/24 118 lb 9.6 oz (53.8 kg)  11/20/23 124 lb (56.2 kg)    Physical Exam  Physical Exam Vitals reviewed.  Constitutional:      Appearance: Normal appearance. Well-developed with normal weight.  Cardiovascular:     Rate and Rhythm: Normal rate and regular rhythm. Normal heart sounds. Normal peripheral pulses Pulmonary:     Normal breath sounds with normal effort Skin:    General: Skin is warm and dry without noticeable rash. Neurological:     General: No focal deficit present.  Psychiatric:        Mood and Affect: Mood, behavior and cognition normal      Assessment & Plan:  No diagnosis found.  No orders of the defined types were placed in this encounter.    Assessment and Plan    Insomnia Chronic insomnia with ineffective trazodone  treatment. Ramelteon  considered for  safety in kidney concerns. - Discontinued trazodone . - Initiated ramelteon , one tablet at bedtime, 30-40 minutes before sleep. - Monitor for side effects such as morning sedation, dizziness, fatigue, and headache. - Ensure steady gait when getting up at night to assess fall risk.  Chronic kidney disease, stage 4 Stage 4 CKD with stable GFR in mid-20s. 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 No urinary symptoms despite cells in urine. - Continue monitoring kidney function with nephrologist. - Continue monitoring anemia levels.  Memory loss Gradual memory decline. Awaiting MRI for cognitive evaluation. Potential vascular dementia considered due to vascular changes. - Await MRI results to assess cognitive changes. - Consider neurology referral for comprehensive evaluation post-MRI. - Discuss statin use and potential discontinuation risk/benefit  Low back pain Intermittent low back pain, possibly related to previous hydrocodone  use. Pain not severe or sleep-disruptive. - Continue to monitor back pain symptoms.                 [1]  Current Outpatient Medications:    amLODipine (NORVASC) 2.5 MG tablet, Take 2.5 mg by mouth every morning., Disp: , Rfl:    amLODipine (NORVASC) 2.5 MG tablet, Take 2.5 mg by mouth 2 (two) times daily., Disp: , Rfl:    buPROPion (WELLBUTRIN XL) 300 MG 24 hr tablet, Take 300 mg by mouth every morning., Disp: , Rfl:    buPROPion (WELLBUTRIN XL) 300 MG 24 hr tablet, Take 300 mg by mouth daily., Disp: , Rfl:    busPIRone (BUSPAR) 5 MG tablet, Take 1 tablet by mouth 2 (two) times daily., Disp: , Rfl:    carvedilol  (COREG ) 3.125  MG tablet, Take 1 tablet (3.125 mg total) by mouth 2 (two) times daily with a meal., Disp: 60 tablet, Rfl: 0   carvedilol  (COREG ) 3.125 MG tablet, Take 3.125 mg by mouth 2 (two) times daily with a meal., Disp: , Rfl:    lidocaine  (LIDODERM ) 5 %, Place 1 patch onto the skin every 12 (twelve) hours. Remove & Discard patch within 12 hours or as  directed by MD, Disp: 10 patch, Rfl: 0   Multiple Vitamins-Minerals (MULTIVITAMIN WITH MINERALS) tablet, Take 1 tablet by mouth daily., Disp: , Rfl:    rosuvastatin (CRESTOR) 10 MG tablet, Take 10 mg by mouth every morning., Disp: , Rfl:    rosuvastatin (CRESTOR) 10 MG tablet, Take 10 mg by mouth daily., Disp: , Rfl:   "

## 2024-06-10 ENCOUNTER — Ambulatory Visit

## 2024-06-10 DIAGNOSIS — I83892 Varicose veins of left lower extremities with other complications: Secondary | ICD-10-CM | POA: Diagnosis not present

## 2024-06-10 DIAGNOSIS — I8390 Asymptomatic varicose veins of unspecified lower extremity: Secondary | ICD-10-CM

## 2024-06-18 ENCOUNTER — Other Ambulatory Visit: Payer: Self-pay

## 2024-06-18 ENCOUNTER — Ambulatory Visit

## 2024-06-18 DIAGNOSIS — G4709 Other insomnia: Secondary | ICD-10-CM

## 2024-06-20 ENCOUNTER — Telehealth

## 2024-06-20 DIAGNOSIS — G4709 Other insomnia: Secondary | ICD-10-CM

## 2024-06-20 DIAGNOSIS — R413 Other amnesia: Secondary | ICD-10-CM

## 2024-06-20 NOTE — Progress Notes (Signed)
 "           Progress Note  Physician: Eiliana Drone A Blandon Offerdahl, MD   I connected with  Emma Houston on 06/20/24 by a video enabled telemedicine application and verified that I am speaking with the correct person  HPI: Emma Houston is a 84 y.o. female presenting on 06/20/2024 for No chief complaint on file. .  Discussed the use of AI scribe software for clinical note transcription with the patient, who gave verbal consent to proceed.  History of Present Illness       Patient seen in follow-up.  Ongoing issues with memory changes over time and suspected early dementia onset.  Because she did have an MRI which shows some volume loss and nonspecific white matter densities possibly related to vascular changes.  Patient manages to function with activities of daily living with some assistance.  Otherwise mood seems to be stable with Wellbutrin and BuSpar.  We have tried ramelteon  for sleep which was not effective.  She would like to try back to trazodone  again which she was on previously    Medical history:  Relevant past medical, surgical, family and social history reviewed and updated as indicated. Interim medical history since our last visit reviewed.  Allergies and medications reviewed and updated.   ROS: Negative unless specifically indicated above in HPI.   Current Medications[1]           Assessment & Plan:   Encounter Diagnoses  Name Primary?   Memory changes Yes   Other insomnia     No orders of the defined types were placed in this encounter.    Assessment and Plan     #1 insomnia.  She may resume to resume trazodone .  If issues with medication will trial other options.  2.  Memory loss.  Offered neurology evaluation.  We may benefit from doing a MOCA.  They would like to wait for the time being.  Plan otherwise to have his follow-up in about 3 months.                [1]  Current Outpatient Medications:    amLODipine (NORVASC) 2.5 MG tablet, Take  2.5 mg by mouth every morning., Disp: , Rfl:    amLODipine (NORVASC) 2.5 MG tablet, Take 2.5 mg by mouth 2 (two) times daily., Disp: , Rfl:    buPROPion (WELLBUTRIN XL) 300 MG 24 hr tablet, Take 300 mg by mouth every morning., Disp: , Rfl:    buPROPion (WELLBUTRIN XL) 300 MG 24 hr tablet, Take 300 mg by mouth daily., Disp: , Rfl:    busPIRone (BUSPAR) 5 MG tablet, Take 1 tablet by mouth 2 (two) times daily., Disp: , Rfl:    carvedilol  (COREG ) 3.125 MG tablet, Take 1 tablet (3.125 mg total) by mouth 2 (two) times daily with a meal., Disp: 60 tablet, Rfl: 0   carvedilol  (COREG ) 3.125 MG tablet, Take 3.125 mg by mouth 2 (two) times daily with a meal., Disp: , Rfl:    lidocaine  (LIDODERM ) 5 %, Place 1 patch onto the skin every 12 (twelve) hours. Remove & Discard patch within 12 hours or as directed by MD, Disp: 10 patch, Rfl: 0   Multiple Vitamins-Minerals (MULTIVITAMIN WITH MINERALS) tablet, Take 1 tablet by mouth daily., Disp: , Rfl:    rosuvastatin (CRESTOR) 10 MG tablet, Take 10 mg by mouth every morning., Disp: , Rfl:    rosuvastatin (CRESTOR) 10 MG tablet, Take 10 mg by mouth daily., Disp: , Rfl:   "
